# Patient Record
Sex: Male | Born: 1965 | Race: White | Hispanic: No | Marital: Married | Smoking: Never smoker
Health system: Southern US, Community
[De-identification: ages and names within clinical notes are randomized; demographics above are authoritative.]

## PROBLEM LIST (undated history)

## (undated) DIAGNOSIS — J45909 Unspecified asthma, uncomplicated: Secondary | ICD-10-CM

## (undated) HISTORY — DX: Unspecified asthma, uncomplicated: J45.909

## (undated) HISTORY — PX: TONSILLECTOMY AND ADENOIDECTOMY: SUR1326

## (undated) HISTORY — PX: TYMPANOSTOMY TUBE PLACEMENT: SHX32

---

## 2010-11-07 ENCOUNTER — Inpatient Hospital Stay (INDEPENDENT_AMBULATORY_CARE_PROVIDER_SITE_OTHER)
Admission: RE | Admit: 2010-11-07 | Discharge: 2010-11-07 | Disposition: A | Discharge status: Home / Self Care | Payer: 59 | Source: Ambulatory Visit | Attending: Emergency Medicine | Admitting: Emergency Medicine

## 2010-11-07 ENCOUNTER — Encounter: Payer: Self-pay | Admitting: Emergency Medicine

## 2010-11-07 DIAGNOSIS — J0100 Acute maxillary sinusitis, unspecified: Secondary | ICD-10-CM

## 2010-11-07 DIAGNOSIS — J039 Acute tonsillitis, unspecified: Secondary | ICD-10-CM

## 2010-11-07 LAB — CONVERTED CEMR LAB: Rapid Strep: NEGATIVE

## 2011-04-28 NOTE — Progress Notes (Signed)
Summary: SORE THORAT (rm 2)   Vital Signs:  Patient Profile:   45 Years Old Male CC:      sore throat, productive cough, ear pain, HA x 4 days Height:     68 inches Weight:      237 pounds O2 Sat:      97 % O2 treatment:    Room Air Temp:     99.4 degrees F oral Pulse rate:   71 / minute Resp:     14 per minute BP sitting:   140 / 89  (left arm) Cuff size:   large  Vitals Entered By: Lajean Saver RN (November 07, 2010 10:13 AM)                  Updated Prior Medication List: MELOXICAM 7.5 MG TABS (MELOXICAM) taking for 30 days  Current Allergies: No known allergies History of Present Illness Chief Complaint: sore throat, productive cough, ear pain, HA x 4 days History of Present Illness: Pt complains of 4 days of congestion with yellow-green rhinorrhea,  and sore throat. + bilat ear pain. +  productive cough, Had mild dyspnea, not currently. No chest pain. No wheezing.  No nausea No vomiting. + fever, + chills  + recent exposure to strep  REVIEW OF SYSTEMS Constitutional Symptoms       Complains of fever.     Denies chills, night sweats, weight loss, weight gain, and fatigue.      Comments: low grade Eyes       Denies change in vision, eye pain, eye discharge, glasses, contact lenses, and eye surgery. Ear/Nose/Throat/Mouth       Complains of ear pain, sinus problems, sore throat, and hoarseness.      Denies hearing loss/aids, change in hearing, ear discharge, dizziness, frequent runny nose, frequent nose bleeds, and tooth pain or bleeding.  Respiratory       Complains of productive cough and shortness of breath.      Denies dry cough, wheezing, asthma, bronchitis, and emphysema/COPD.  Cardiovascular       Denies murmurs, chest pain, and tires easily with exhertion.    Gastrointestinal       Denies stomach pain, nausea/vomiting, diarrhea, constipation, blood in bowel movements, and indigestion. Genitourniary       Denies painful urination, kidney stones, and loss  of urinary control. Neurological       Complains of headaches.      Denies paralysis, seizures, and fainting/blackouts. Musculoskeletal       Denies muscle pain, joint pain, joint stiffness, decreased range of motion, redness, swelling, muscle weakness, and gout.  Skin       Denies bruising, unusual mles/lumps or sores, and hair/skin or nail changes.  Psych       Denies mood changes, temper/anger issues, anxiety/stress, speech problems, depression, and sleep problems. Other Comments: Recent exposure to strep. Taken tylenol cold and sore throat   Past History:  Past Medical History: Hx PNA  Past Surgical History: Tonsillectomy  Social History: Never Smoked Alcohol use-no Drug use-no Smoking Status:  never Drug Use:  no Physical Exam General appearance: well developed, well nourished, no acute distress Head: normocephalic, atraumatic. + mild max sinus ttp bilat Eyes: conjunctivae and lids normal Ears: minimal bilat serous om. otherwise wnl Nasal: thick yellowish drainage Oral/Pharynx: bilateral mild tonsilar enlargement and redness, uvula midline without deviation Neck: supple,minimal anterior lymphadenopathy present Chest/Lungs: no rales, wheezes, or rhonchi bilateral, breath sounds equal without effort Heart: regular rate and  rhythm, no murmur Neurological: grossly intact and non-focal Skin: no obvious rashes or lesions MSE: oriented to time, place, and person Assessment New Problems: ACUTE MAXILLARY SINUSITIS (ICD-461.0) ACUTE TONSILLITIS (ICD-463)   Patient Education: Patient and/or caregiver instructed in the following: rest, fluids, Tylenol prn. otc tx discussed.  Plan New Medications/Changes: AUGMENTIN 875-125 MG TABS (AMOXICILLIN-POT CLAVULANATE) 1 by mouth two times a day with food for 10 days  #20 x 0, 11/07/2010, Lajean Manes MD  New Orders: Rapid Strep [id number] New Patient Level III [99203] Planning Comments:   Risks, benefits, alternatives  discussed. Pt voiced understanding and agreement.  Follow Up: Follow up in 2-3 days if no improvement, Follow up with Primary Physician  The patient and/or caregiver has been counseled thoroughly with regard to medications prescribed including dosage, schedule, interactions, rationale for use, and possible side effects and they verbalize understanding.  Diagnoses and expected course of recovery discussed and will return if not improved as expected or if the condition worsens. Patient and/or caregiver verbalized understanding.  Prescriptions: AUGMENTIN 875-125 MG TABS (AMOXICILLIN-POT CLAVULANATE) 1 by mouth two times a day with food for 10 days  #20 x 0   Entered and Authorized by:   Lajean Manes MD   Signed by:   Lajean Manes MD on 11/07/2010   Method used:   Electronically to        [facility] [phone]* (retail)       [address] [place], [place]  [address]       Ph: [phone] or [phone]       Fax: [phone]   RxID:   [id number]   Orders Added: 1)  Rapid Strep [03474] 2)  New Patient Level III [99203]    Laboratory Results  Date/Time Received: November 07, 2010 10:30 AM  Date/Time Reported: November 07, 2010 10:30 AM   Other Tests  Rapid Strep: negative

## 2011-12-05 ENCOUNTER — Emergency Department
Admission: EM | Admit: 2011-12-05 | Discharge: 2011-12-05 | Disposition: A | Discharge status: Home / Self Care | Payer: BC Managed Care – PPO | Source: Home / Self Care

## 2011-12-05 DIAGNOSIS — H60399 Other infective otitis externa, unspecified ear: Secondary | ICD-10-CM

## 2011-12-05 DIAGNOSIS — H6090 Unspecified otitis externa, unspecified ear: Secondary | ICD-10-CM

## 2011-12-05 MED ORDER — NEOMYCIN-POLYMYXIN-HC 3.5-10000-1 OT SOLN: 3.0000 [drp] | Freq: Four times a day (QID) | OTIC | Status: AC

## 2011-12-05 NOTE — ED Provider Notes (Signed)
Agree with exam, assessment, and plan.   Lattie Haw, MD 12/05/11 2238

## 2011-12-05 NOTE — ED Notes (Signed)
Christian Mata complains of right ear pain for a couple of days. He did go swimming 2 nights ago. Denies fever, chills or sweats.

## 2011-12-05 NOTE — ED Provider Notes (Signed)
History     CSN: [id number]  Arrival date & time 12/05/11  4098   First MD Initiated Contact with Patient 12/05/11 1906      Chief Complaint  Patient presents with  . Otalgia    right ear pain for a couple days   Patient is a 46 y.o. male presenting with ear pain.  Otalgia This is a new problem. The current episode started yesterday. There is pain in the right ear. The problem occurs constantly. The problem has not changed since onset.Associated symptoms include rash. Pertinent negatives include no ear discharge, no headaches, no hearing loss and no neck pain. Associated symptoms comments: + R ear fullness, R ear discomfort .  Pt swims in his pool on a regular basis.  Nonsmoker.   History reviewed. No pertinent past medical history.  Past Surgical History  Procedure Date  . Tonsillectomy and adenoidectomy     Family History  Problem Relation Age of Onset  . Cancer Mother     Lung  . Cancer Father     brain  . Cancer Other     bone    History  Substance Use Topics  . Smoking status: Never Smoker   . Smokeless tobacco: Never Used  . Alcohol Use: 1.2 oz/week    2 Shots of liquor per week      Review of Systems  HENT: Positive for ear pain. Negative for hearing loss, neck pain and ear discharge.   Skin: Positive for rash.  Neurological: Negative for headaches.  All other systems reviewed and are negative.    Allergies  Review of patient's allergies indicates no known allergies.  Home Medications   Current Outpatient Rx  Name Route Sig Dispense Refill  . FEXOFENADINE HCL 180 MG PO TABS Oral Take 180 mg by mouth daily.    . NEOMYCIN-POLYMYXIN-HC 3.5-10000-1 OT SOLN Right Ear Place 3 drops into the right ear 4 (four) times daily. 10 mL 0    BP 132/79  Pulse 63  Temp 97.5 F (36.4 C) (Oral)  Resp 18  Ht 5' 9.5" (1.765 m)  Wt 238 lb (107.956 kg)  BMI 34.64 kg/m2  SpO2 95%  Physical Exam  Constitutional: He appears well-developed and well-nourished.    HENT:  Head: Normocephalic and atraumatic.  Left Ear: External ear normal.       R ear canal erythema + tenderness to otoscopic evaluation  Eyes: Conjunctivae are normal. Pupils are equal, round, and reactive to light.  Neck: Normal range of motion. Neck supple.  Cardiovascular: Normal rate and regular rhythm.   Pulmonary/Chest: Effort normal. No respiratory distress.  Abdominal: Soft.  Musculoskeletal: Normal range of motion.  Neurological: He is alert.  Skin: Skin is warm.    ED Course  Procedures (including critical care time)  Labs Reviewed - No data to display No results found.   1. Otitis externa       MDM  Will treat with cortisporin otic. Discussed infectious red flags and safe swimming practices. Handout given.  Follow up as needed.     The patient and/or caregiver has been counseled thoroughly with regard to treatment plan and/or medications prescribed including dosage, schedule, interactions, rationale for use, and possible side effects and they verbalize understanding. Diagnoses and expected course of recovery discussed and will return if not improved as expected or if the condition worsens. Patient and/or caregiver verbalized understanding.             Floydene Flock, MD 12/05/11  1944 

## 2013-09-22 ENCOUNTER — Emergency Department
Admission: EM | Admit: 2013-09-22 | Discharge: 2013-09-22 | Disposition: A | Discharge status: Home / Self Care | Payer: 59 | Source: Home / Self Care | Attending: Emergency Medicine | Admitting: Emergency Medicine

## 2013-09-22 ENCOUNTER — Encounter: Payer: Self-pay | Admitting: Emergency Medicine

## 2013-09-22 DIAGNOSIS — L03113 Cellulitis of right upper limb: Secondary | ICD-10-CM

## 2013-09-22 DIAGNOSIS — L02413 Cutaneous abscess of right upper limb: Secondary | ICD-10-CM

## 2013-09-22 DIAGNOSIS — IMO0002 Reserved for concepts with insufficient information to code with codable children: Secondary | ICD-10-CM

## 2013-09-22 MED ORDER — DOXYCYCLINE HYCLATE 100 MG PO CAPS: 100.0000 mg | ORAL_CAPSULE | Freq: Two times a day (BID) | ORAL | Status: DC

## 2013-09-22 NOTE — ED Notes (Signed)
Reports noticing a lump with bruising coloration and tiny focal point just distal to right elbow 24 hours ago. No known injury. Does work outdoors.

## 2013-09-22 NOTE — ED Provider Notes (Signed)
CSN: [id number]     Arrival date & time 09/22/13  1805 History   First MD Initiated Contact with Patient 09/22/13 1828     Chief Complaint  Patient presents with  . Mass   (Consider location/radiation/quality/duration/timing/severity/associated sxs/prior Treatment) HPI Swelling and pain skin right forearm. Times one day . Pain is mild. Exacerbated by touching it. He tried squeezing pimple in the Center, but didn't get anything out. He feels that made it worse.  He recalls no specific injury or insect bite, but he has been outside in the woods, and may have nicked the area in a briar.  Otherwise, denies any other symptoms. No fever or chills. No red streaks. Denies any bony pain. Denies elbow or wrist symptoms.  History reviewed. No pertinent past medical history. Past Surgical History  Procedure Laterality Date  . Tonsillectomy and adenoidectomy     Family History  Problem Relation Age of Onset  . Cancer Mother     Lung  . Cancer Father     brain  . Cancer Other     bone   History  Substance Use Topics  . Smoking status: Never Smoker   . Smokeless tobacco: Never Used  . Alcohol Use: 1.2 oz/week    2 Shots of liquor per week    Review of Systems  All other systems reviewed and are negative.   Allergies  Review of patient's allergies indicates no known allergies.  Home Medications   Prior to Admission medications   Medication Sig Start Date End Date Taking? Authorizing Provider  doxycycline (VIBRAMYCIN) 100 MG capsule Take 1 capsule (100 mg total) by mouth 2 (two) times daily. 09/22/13   Jacqulyn Cane, MD  fexofenadine (ALLEGRA) 180 MG tablet Take 180 mg by mouth daily.    Historical Provider, MD   BP 130/73  Pulse 60  Temp(Src) 97.8 F (36.6 C) (Oral)  Resp 16  Ht 5' 8.75" (1.746 m)  Wt 232 lb (105.235 kg)  BMI 34.52 kg/m2  SpO2 97% Physical Exam  Nursing note and vitals reviewed. Constitutional: He is oriented to person, place, and time. He appears  well-developed and well-nourished. No distress.  HENT:  Head: Normocephalic and atraumatic.  Eyes: Conjunctivae and EOM are normal. Pupils are equal, round, and reactive to light. No scleral icterus.  Neck: Normal range of motion.  Cardiovascular: Normal rate.   Pulmonary/Chest: Effort normal.  Abdominal: He exhibits no distension.  Musculoskeletal: Normal range of motion.       Right elbow: Normal.      Right forearm: He exhibits swelling.  Right forearm: 1 x 1 cm area of swelling, mild tenderness, erythema, warmth, and induration. In the Center is a tiny pustule.--No bony or deep tenderness. No red streaks.  Neurovascular distally intact Range of motion and tendons right upper extremity intact  Neurological: He is alert and oriented to person, place, and time.  Skin: Skin is warm.  Psychiatric: He has a normal mood and affect.    ED Course  Procedures (including critical care time) Labs Review Labs Reviewed  WOUND CULTURE    Imaging Review No results found.   MDM   1. Abscess of forearm, right   2. Cellulitis of forearm, right    Treatment options discussed, as well as risks, benefits, alternatives. Patient voiced understanding and agreement with the following plans:  After risks, benefits, alternatives discussed, patient requested and consented to the following procedure : After sterile prep, I nicked the pustule with #11 blade and  tiny amount of pus and blood exuded. Sent off for culture. Band-Aid applied. Good hemostasis. He tolerated well  Treat with doxycycline 100 mg twice a day x10 days Wound culture pending Red flags discussed. Followup here or with PCP if not improving in 3-4 days, sooner if worse or new  Precautions discussed. Red flags discussed. Questions invited and answered. Patient voiced understanding and agreement.     Jacqulyn Cane, MD 09/22/13 [phone]

## 2013-09-25 LAB — WOUND CULTURE
Gram Stain: NONE SEEN
Gram Stain: NONE SEEN
Gram Stain: NONE SEEN
Organism ID, Bacteria: NO GROWTH

## 2013-09-27 ENCOUNTER — Telehealth: Payer: Self-pay | Admitting: *Deleted

## 2014-05-09 ENCOUNTER — Emergency Department
Admission: EM | Admit: 2014-05-09 | Discharge: 2014-05-09 | Disposition: A | Discharge status: Home / Self Care | Payer: 59 | Source: Home / Self Care | Attending: Emergency Medicine | Admitting: Emergency Medicine

## 2014-05-09 ENCOUNTER — Encounter: Payer: Self-pay | Admitting: *Deleted

## 2014-05-09 ENCOUNTER — Emergency Department (INDEPENDENT_AMBULATORY_CARE_PROVIDER_SITE_OTHER): Payer: 59

## 2014-05-09 DIAGNOSIS — S60021A Contusion of right index finger without damage to nail, initial encounter: Secondary | ICD-10-CM

## 2014-05-09 DIAGNOSIS — S61210A Laceration without foreign body of right index finger without damage to nail, initial encounter: Secondary | ICD-10-CM

## 2014-05-09 DIAGNOSIS — Z23 Encounter for immunization: Secondary | ICD-10-CM

## 2014-05-09 DIAGNOSIS — W230XXA Caught, crushed, jammed, or pinched between moving objects, initial encounter: Secondary | ICD-10-CM

## 2014-05-09 DIAGNOSIS — T1490XA Injury, unspecified, initial encounter: Secondary | ICD-10-CM

## 2014-05-09 MED ORDER — CEPHALEXIN 500 MG PO CAPS: 500.0000 mg | ORAL_CAPSULE | Freq: Two times a day (BID) | ORAL | Status: DC

## 2014-05-09 MED ORDER — TETANUS-DIPHTH-ACELL PERTUSSIS 5-2.5-18.5 LF-MCG/0.5 IM SUSP
0.5000 mL | Freq: Once | INTRAMUSCULAR | Status: AC
  Administered 2014-05-09: 0.5 mL via INTRAMUSCULAR

## 2014-05-09 NOTE — ED Provider Notes (Signed)
CSN: [id number]     Arrival date & time 05/09/14  0803 History   First MD Initiated Contact with Patient 05/09/14 0815     Chief Complaint  Patient presents with  . Finger Injury   (Consider location/radiation/quality/duration/timing/severity/associated sxs/prior Treatment) HPI About 2 hours ago, he accidentally closed right index finger on his car door. Immediate sharp pain. Pain was severe, now pain is 3 out of 10. Sustained laceration with mild bleeding, controlled with direct pressure. Minimal numbness around the wound. Last tetanus shot over 5 years ago. History reviewed. No pertinent past medical history. Past Surgical History  Procedure Laterality Date  . Tonsillectomy and adenoidectomy     Family History  Problem Relation Age of Onset  . Cancer Mother     Lung  . Cancer Father     brain  . Cancer Other     bone   History  Substance Use Topics  . Smoking status: Never Smoker   . Smokeless tobacco: Never Used  . Alcohol Use: 1.2 oz/week    2 Shots of liquor per week    Review of Systems  All other systems reviewed and are negative.   Allergies  Review of patient's allergies indicates no known allergies.  Home Medications   Prior to Admission medications   Medication Sig Start Date End Date Taking? Authorizing Provider  cephALEXin (KEFLEX) 500 MG capsule Take 1 capsule (500 mg total) by mouth 2 (two) times daily. For 5 days 05/09/14   Jacqulyn Cane, MD   BP 144/89 mmHg  Pulse 53  Temp(Src) 97.5 F (36.4 C) (Oral)  Resp 18  Ht 5\' 9"  (1.753 m)  Wt 235 lb (106.595 kg)  BMI 34.69 kg/m2  SpO2 97% Physical Exam  Constitutional: He is oriented to person, place, and time. He appears well-developed and well-nourished. No distress.  HENT:  Head: Normocephalic and atraumatic.  Eyes: Conjunctivae and EOM are normal. Pupils are equal, round, and reactive to light. No scleral icterus.  Neck: Normal range of motion.  Cardiovascular: Normal rate.   Pulmonary/Chest:  Effort normal.  Abdominal: He exhibits no distension.  Musculoskeletal: Normal range of motion.       Hands: As depicted, palmar aspect of right index finger at the DIP: Full skin thickness 2 cm linear laceration. Wound cleansed vigorously with Hibiclens. Irrigated Inspected again, no foreign body seen. No active bleeding. Neurovascular bundle intact. Has minimal numbness around the wound but fingertip sensation intact. There is bony tenderness along the DIP, with normal range of motion. Tendons intact.  Neurological: He is alert and oriented to person, place, and time.  Skin: Skin is warm.  Psychiatric: He has a normal mood and affect.  Nursing note and vitals reviewed.   ED Course  Procedures (including critical care time) Labs Review Labs Reviewed - No data to display  Imaging Review Dg Finger Index Right  05/09/2014   CLINICAL DATA:  Closed door on finger this morning. Laceration to the posterior anterior DIP joint. Initial encounter.  EXAM: RIGHT INDEX FINGER 2+V  COMPARISON:  None.  FINDINGS: There is no evidence of fracture or dislocation. 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 Soft tissues are unremarkable.  IMPRESSION: No acute osseous injury of the right second digit.   Electronically Signed   By: Kathreen Devoid   On: 05/09/2014 08:37     MDM   1. Laceration of right index finger w/o foreign body w/o damage to nail, initial encounter   2. Injury  3. Contusion of right index finger without damage to nail, initial encounter    Treatment options discussed, as well as risks, benefits, alternatives. Patient voiced understanding and agreement with the following plans: Procedure: Risks, benefits, alternatives discussed. Patient agreed and consented to the following procedure: Wound cleansed and irrigated with sterile technique. No evidence of tendon or neurovascular deficit. No foreign body. Wound repaired with surgical glue with good approximation and hemostasis.  Nonstick dressing, bulky Coban dressing. Wound care discussed. Red flags discussed.  Reviewed x-ray right finger negative for any acute abnormality. He declined prescription pain meds, but may use ibuprofen when necessary pain. DTaP . Cephalexin prescribed to help prevent infection See detailed Instructions in AVS, which were given to patient. Verbal instructions also given. Risks, benefits, and alternatives of treatment options discussed. Questions invited and answered. Patient voiced understanding and agreement with plans.     Jacqulyn Cane, MD 05/09/14 1006

## 2014-05-09 NOTE — Discharge Instructions (Signed)

## 2014-05-09 NOTE — ED Notes (Signed)
Christian Mata reports that he closed his RT 2nd finger in his car door this morning. He reports 2 lacerations on his finger.

## 2014-08-20 ENCOUNTER — Emergency Department (HOSPITAL_BASED_OUTPATIENT_CLINIC_OR_DEPARTMENT_OTHER): Payer: 59

## 2014-08-20 ENCOUNTER — Encounter (HOSPITAL_BASED_OUTPATIENT_CLINIC_OR_DEPARTMENT_OTHER): Payer: Self-pay

## 2014-08-20 ENCOUNTER — Emergency Department (HOSPITAL_BASED_OUTPATIENT_CLINIC_OR_DEPARTMENT_OTHER)
Admission: EM | Admit: 2014-08-20 | Discharge: 2014-08-20 | Disposition: A | Discharge status: Home / Self Care | Payer: 59 | Attending: Emergency Medicine | Admitting: Emergency Medicine

## 2014-08-20 DIAGNOSIS — W458XXA Other foreign body or object entering through skin, initial encounter: Secondary | ICD-10-CM | POA: Diagnosis not present

## 2014-08-20 DIAGNOSIS — Y9389 Activity, other specified: Secondary | ICD-10-CM | POA: Insufficient documentation

## 2014-08-20 DIAGNOSIS — Y9289 Other specified places as the place of occurrence of the external cause: Secondary | ICD-10-CM | POA: Insufficient documentation

## 2014-08-20 DIAGNOSIS — S61219A Laceration without foreign body of unspecified finger without damage to nail, initial encounter: Secondary | ICD-10-CM

## 2014-08-20 DIAGNOSIS — S65506A Unspecified injury of blood vessel of right little finger, initial encounter: Secondary | ICD-10-CM | POA: Diagnosis present

## 2014-08-20 DIAGNOSIS — Y998 Other external cause status: Secondary | ICD-10-CM | POA: Insufficient documentation

## 2014-08-20 MED ORDER — HYDROCODONE-ACETAMINOPHEN 5-325 MG PO TABS: ORAL_TABLET | ORAL | Status: DC

## 2014-08-20 MED ORDER — CEPHALEXIN 250 MG PO CAPS
500.0000 mg | ORAL_CAPSULE | Freq: Once | ORAL | Status: AC
  Administered 2014-08-20: 500 mg via ORAL
  Filled 2014-08-20: qty 2

## 2014-08-20 MED ORDER — LIDOCAINE HCL 2 % IJ SOLN
20.0000 mL | Freq: Once | INTRAMUSCULAR | Status: AC
  Administered 2014-08-20: 400 mg via INTRADERMAL
  Filled 2014-08-20: qty 20

## 2014-08-20 MED ORDER — CEPHALEXIN 500 MG PO CAPS: 500.0000 mg | ORAL_CAPSULE | Freq: Four times a day (QID) | ORAL | Status: DC

## 2014-08-20 NOTE — Discharge Instructions (Signed)
Wash the affected area with soap and water and apply a thin layer of topical antibiotic ointment. Do this every 12 hours.   Do not use rubbing alcohol or hydrogen peroxide.                        Look for signs of infection: if you see redness, if the area becomes warm, if pain increases sharply, there is discharge (pus), if red streaks appear or you develop fever or vomiting, RETURN immediately to the Emergency Department  for a recheck.   Please follow with your primary care doctor in the next 2 days for a check-up. They must obtain records for further management.   Do not hesitate to return to the Emergency Department for any new, worsening or concerning symptoms.    Fingernail Removal Fingernails may need to be removed because of injury, infections, or correction of abnormal growth. A special non-stick bandage has been put on your finger tightly to prevent bleeding. Fingernails will usually grow back if the finger has not been badly injured and you carefully follow instructions. HOME CARE INSTRUCTIONS   Keep your hand elevated above your heart to relieve pain and swelling.  Keep your dressing dry and clean.  Change your bandage in 24 hours.  After your bandage is changed, soak your hand in warm soapy water for 10 to 20 minutes. Do this 3 times per day. This helps reduce pain and swelling. After soaking your hand, apply a clean, dry bandage. Change your bandage if it is wet or dirty.  Only take over-the-counter or prescription medicines for pain, discomfort, or fever as directed by your caregiver.  See your caregiver as needed for problems.  You may have received an instruction to follow up with your caregiver or a specialist. The failure to follow up as instructed could result in the permanent loss of a fingernail. SEEK IMMEDIATE MEDICAL CARE IF:   You have increased pain, swelling, drainage, or bleeding.  You have a fever. MAKE SURE YOU:   Understand these  instructions.  Will watch your condition.  Will get help right away if you are not doing well or get worse. Document Released: 05/09/2000 Document Revised: 08/04/2011 Document Reviewed: 09/14/2007 [facility] Patient Information 2015 [place], [place]. This information is not intended to replace advice given to you by your health care provider. Make sure you discuss any questions you have with your health care provider.

## 2014-08-20 NOTE — ED Notes (Signed)
PA at bedside.

## 2014-08-20 NOTE — ED Notes (Signed)
Patient here with wood splinter under right hand little finger nailbed x 1 day, pain, redness, and swelling to same

## 2014-08-20 NOTE — ED Notes (Signed)
Pt reports yesterday was trimming dogs nails when dog was wiggling.  States he attempted to stop dog from wiggling forcefully when he impacted wood on side of pool with right fifth digit.  Splinter under lateral aspect of nailbed on same, bruising and some cut nail noted.  Small amount of swelling and tenderness to site.

## 2014-08-20 NOTE — ED Provider Notes (Signed)
CSN: [id number]     Arrival date & time 08/20/14  1138 History   First MD Initiated Contact with Patient 08/20/14 1255     Chief Complaint  Patient presents with  . Foreign Body in Skin     (Consider location/radiation/quality/duration/timing/severity/associated sxs/prior Treatment) HPI   Christian Mata is a 49 y.o. male complaining of wooden splinter to left small digit underneath the nail onset yesterday. Last tetanus shot was within the last 2 weeks. He states that when he was trying to trim his dog's nails he accidentally hit the hand on his deck in the splinter went under. Pain is minimal, 3 out of 10 and exacerbated by movement and palpation. Not taking any pain medication at home. Patient denies diabetes, chronic steroids or other immunosuppressive conditions. He is left-hand dominant.  History reviewed. No pertinent past medical history. Past Surgical History  Procedure Laterality Date  . Tonsillectomy and adenoidectomy     Family History  Problem Relation Age of Onset  . Cancer Mother     Lung  . Cancer Father     brain  . Cancer Other     bone   History  Substance Use Topics  . Smoking status: Never Smoker   . Smokeless tobacco: Never Used  . Alcohol Use: 1.2 oz/week    2 Shots of liquor per week    Review of Systems  10 systems reviewed and found to be negative, except as noted in the HPI.   Allergies  Review of patient's allergies indicates no known allergies.  Home Medications   Prior to Admission medications   Medication Sig Start Date End Date Taking? Authorizing Provider  cephALEXin (KEFLEX) 500 MG capsule Take 1 capsule (500 mg total) by mouth 4 (four) times daily. 08/20/14   Jaydrien Wassenaar, PA-C  HYDROcodone-acetaminophen (NORCO/VICODIN) 5-325 MG per tablet Take 1-2 tablets by mouth every 6 hours as needed for pain and/or cough. 08/20/14   Blong Busk, PA-C   BP 129/59 mmHg  Pulse 56  Temp(Src) 97.6 F (36.4 C) (Oral)  Resp 18  Ht 5\' 9"   (1.753 m)  Wt 227 lb (102.967 kg)  BMI 33.51 kg/m2  SpO2 98% Physical Exam  Constitutional: He is oriented to person, place, and time. He appears well-developed and well-nourished. No distress.  HENT:  Head: Normocephalic.  Eyes: Conjunctivae and EOM are normal.  Cardiovascular: Normal rate.   Pulmonary/Chest: Effort normal. No stridor.  Musculoskeletal: Normal range of motion.  Neurological: He is alert and oriented to person, place, and time.  Skin:  Point or extending the length of the nail on the right fifth digit on the ulnar side. bleeding is controlled.  Psychiatric: He has a normal mood and affect.  Nursing note and vitals reviewed.   ED Course  NAIL REMOVAL Date/Time: 08/20/2014 1:32 PM Performed by: Monico Blitz Authorized by: Monico Blitz Consent: Verbal consent obtained. Risks and benefits: risks, benefits and alternatives were discussed Consent given by: patient Patient identity confirmed: verbally with patient Time out: Immediately prior to procedure a "time out" was called to verify the correct patient, procedure, equipment, support staff and site/side marked as required. Location: right hand Location details: right small finger Anesthesia: digital block Local anesthetic: lidocaine 2% without epinephrine Anesthetic total: 4 ml Patient sedated: no Preparation: skin prepped with ChloraPrep Amount removed: 1/2 Side: ulnar Wedge excision of skin of nail fold: no Nail bed sutured: no Nail matrix removed: none Removed nail replaced and anchored: no Dressing: antibiotic ointment, non-adhesive  packing strip and 4x4 Patient tolerance: Patient tolerated the procedure well with no immediate complications  FOREIGN BODY REMOVAL Date/Time: 08/20/2014 2:11 PM Performed by: Monico Blitz Authorized by: Monico Blitz Consent: Verbal consent obtained. Consent given by: patient Patient identity confirmed: verbally with patient Body area: skin General  location: upper extremity Location details: right small finger Anesthesia: digital block Local anesthetic: lidocaine 2% without epinephrine Anesthetic total: 4 ml Patient sedated: no Patient restrained: no Localization method: magnification and visualized Removal mechanism: forceps and irrigation Dressing: antibiotic ointment Tendon involvement: superficial Depth: subcutaneous Complexity: simple 1 objects recovered. Objects recovered: wood splinterr Post-procedure assessment: foreign body removed Patient tolerance: Patient tolerated the procedure well with no immediate complications   (including critical care time) Labs Review Labs Reviewed - No data to display  Imaging Review Dg Hand Complete Right  08/20/2014   CLINICAL DATA:  Trauma to the right fifth digit with wood splinter visualized per patient  EXAM: RIGHT HAND - COMPLETE 3+ VIEW  COMPARISON:  05/09/2014 right index finger radiograph  FINDINGS: Well corticated remote tuft fracture of the right fourth digit is identified. Nail bed deformity is visualized at the reported location of this finding at the distal right fifth digit. No radiopaque foreign body. No fracture or dislocation is scratch but no acute fracture or dislocation is identified.  IMPRESSION: Right fifth digit nail bed deformity at the location of the reported clinical finding. No acute fracture or dislocation or radiopaque foreign body.   Electronically Signed   By: Conchita Paris M.D.   On: 08/20/2014 12:17     EKG Interpretation None      MDM   Final diagnoses:  Laceration of finger without foreign body without damage to nail, initial encounter    Filed Vitals:   08/20/14 1149 08/20/14 1405  BP: 129/84 129/59  Pulse: 54 56  Temp: 97.8 F (36.6 C) 97.6 F (36.4 C)  TempSrc: Oral Oral  Resp: 18 18  Height: 5\' 9"  (1.753 m)   Weight: 227 lb (102.967 kg)   SpO2: 99% 98%    Medications  lidocaine (XYLOCAINE) 2 % (with pres) injection 400 mg (400  mg Intradermal Given by Other 08/20/14 1305)  cephALEXin (KEFLEX) capsule 500 mg (500 mg Oral Given 08/20/14 1305)    Christian Mata is a pleasant 49 y.o. male presenting with wooden splinter embedded deeply underneath the right fifth nail bed. Patient's tetanus is up-to-date, I had to remove half of the nail to access the foreign body. Patient will be started on Keflex, given instructions on wound care. Vicodin given for pain control.  Evaluation does not show pathology that would require ongoing emergent intervention or inpatient treatment. Pt is hemodynamically stable and mentating appropriately. Discussed findings and plan with patient/guardian, who agrees with care plan. All questions answered. Return precautions discussed and outpatient follow up given.   New Prescriptions   CEPHALEXIN (KEFLEX) 500 MG CAPSULE    Take 1 capsule (500 mg total) by mouth 4 (four) times daily.   HYDROCODONE-ACETAMINOPHEN (NORCO/VICODIN) 5-325 MG PER TABLET    Take 1-2 tablets by mouth every 6 hours as needed for pain and/or cough.         Monico Blitz, PA-C 08/20/14 [place], MD 08/20/14 [phone]

## 2014-12-02 ENCOUNTER — Encounter: Payer: Self-pay | Admitting: Emergency Medicine

## 2014-12-02 ENCOUNTER — Emergency Department
Admission: EM | Admit: 2014-12-02 | Discharge: 2014-12-02 | Disposition: A | Discharge status: Home / Self Care | Payer: 59 | Source: Home / Self Care | Attending: Family Medicine | Admitting: Family Medicine

## 2014-12-02 DIAGNOSIS — R51 Headache: Secondary | ICD-10-CM

## 2014-12-02 DIAGNOSIS — W57XXXA Bitten or stung by nonvenomous insect and other nonvenomous arthropods, initial encounter: Secondary | ICD-10-CM

## 2014-12-02 DIAGNOSIS — R6889 Other general symptoms and signs: Secondary | ICD-10-CM

## 2014-12-02 DIAGNOSIS — R519 Headache, unspecified: Secondary | ICD-10-CM

## 2014-12-02 DIAGNOSIS — T148 Other injury of unspecified body region: Secondary | ICD-10-CM

## 2014-12-02 DIAGNOSIS — Z8619 Personal history of other infectious and parasitic diseases: Secondary | ICD-10-CM

## 2014-12-02 LAB — BASIC METABOLIC PANEL WITH GFR
BUN: 20 mg/dL (ref 6–23)
CO2: 23 mEq/L (ref 19–32)
Calcium: 9.1 mg/dL (ref 8.4–10.5)
Chloride: 105 mEq/L (ref 96–112)
Creat: 0.97 mg/dL (ref 0.50–1.35)
GFR, Est African American: >89 mL/min
GFR, Est Non African American: >89 mL/min
Glucose, Bld: 95 mg/dL (ref 70–99)
Potassium: 3.9 mEq/L (ref 3.5–5.3)
Sodium: 139 mEq/L (ref 135–145)

## 2014-12-02 LAB — POCT CBC W AUTO DIFF (K'VILLE URGENT CARE)

## 2014-12-02 MED ORDER — DOXYCYCLINE HYCLATE 100 MG PO CAPS: 100.0000 mg | ORAL_CAPSULE | Freq: Two times a day (BID) | ORAL | Status: DC

## 2014-12-02 MED ORDER — VALACYCLOVIR HCL 1 G PO TABS: 2000.0000 mg | ORAL_TABLET | Freq: Two times a day (BID) | ORAL | Status: DC

## 2014-12-02 NOTE — Discharge Instructions (Signed)
You may take acetaminophen and ibuprofen as needed for pain. Be sure to stay well hydrated. Take antibiotics and antivirals exactly as prescribed. You did have blood work performed today to test for tick-borne illnesses, you should be notified within 4 days of results and advised if any additional treatment or follow up is indicated. If symptoms worsen including severe pain, difficulty breathing, unable to keep down fluids, or other new concerning symptoms develop, please go to the closest ER or call 911 Please establish care with a primary care provider for ongoing healthcare needs, including continued management of recurrent cold sores.  See below for further instructions.

## 2014-12-02 NOTE — ED Provider Notes (Signed)
CSN: [id number]     Arrival date & time 12/02/14  1116 History   First MD Initiated Contact with Patient 12/02/14 1151     Chief Complaint  Patient presents with  . Headache  . Fatigue   (Consider location/radiation/quality/duration/timing/severity/associated sxs/prior Treatment) HPI  Patient is a 49 year old male presenting to urgent care with complaints of intermittent generalized headache that is mild to moderate in severity with associated body aches, fever with Tmax of 103, as well as mild intermittent watery diarrhea without blood or mucus in stool.  Symptoms have been present for last 2 weeks.  Patient states he also has a lot of fatigue and generalized weakness.  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  Patient does note a mild red rash on bilateral forearms described as tiny little red circles that do not hurt or itch.  Rash has started to improve on its own.  No known allergies.  Denies neck pain or stiffness.  Denies headache at this time.  Denies chest pain or difficulty breathing.  Reports mild nausea but denies vomiting.  Denies sick contacts or recent travel.    On a, unrelated matter, pt requests a medication be prescribed for recurrent cold sores.  States a family member has had valacyclovir for cold sores and does report significant improvement. Patient would like to try this medication.  Denies any cold sores at this time.  States he only gets them a handful of times a year when he gets sick or stressed. Patient states he is otherwise healthy.  No other significant past medical history.  History reviewed. No pertinent past medical history. Past Surgical History  Procedure Laterality Date  . Tonsillectomy and adenoidectomy     Family History  Problem Relation Age of Onset  . Cancer Mother     Lung  . Cancer Father     brain  . Cancer Other     bone   History  Substance Use Topics  . Smoking status: Never Smoker    . Smokeless tobacco: Never Used  . Alcohol Use: 1.2 oz/week    2 Shots of liquor per week    Review of Systems  Constitutional: Positive for fever ( 103), chills and fatigue. Negative for diaphoresis and appetite change.  HENT: Negative for congestion, sore throat, trouble swallowing and voice change.   Respiratory: Negative for cough and shortness of breath.   Cardiovascular: Negative for chest pain and leg swelling.  Gastrointestinal: Positive for nausea and diarrhea. Negative for vomiting, abdominal pain and blood in stool.  Musculoskeletal: Positive for myalgias and arthralgias. Negative for neck pain and neck stiffness.  Skin: Positive for rash. Negative for color change, pallor and wound.  Neurological: Positive for weakness ( generalized) and headaches. Negative for dizziness, seizures, syncope, light-headedness and numbness.  All other systems reviewed and are negative.   Allergies  Review of patient's allergies indicates no known allergies.  Home Medications   Prior to Admission medications   Medication Sig Start Date End Date Taking? Authorizing Provider  cephALEXin (KEFLEX) 500 MG capsule Take 1 capsule (500 mg total) by mouth 4 (four) times daily. 08/20/14   Nicole Pisciotta, PA-C  doxycycline (VIBRAMYCIN) 100 MG capsule Take 1 capsule (100 mg total) by mouth 2 (two) times daily. One po bid x 10 days 12/02/14   Noland Fordyce, PA-C  HYDROcodone-acetaminophen (NORCO/VICODIN) 5-325 MG per tablet Take 1-2 tablets by mouth every 6 hours as needed for  pain and/or cough. 08/20/14   Nicole Pisciotta, PA-C  valACYclovir (VALTREX) 1000 MG tablet Take 2 tablets (2,000 mg total) by mouth 2 (two) times daily. Once per occurance 12/02/14   Noland Fordyce, PA-C   BP 148/78 mmHg  Pulse 96  Temp(Src) 98.3 F (36.8 C) (Oral)  SpO2 96% Physical Exam  Constitutional: He is oriented to person, place, and time. He appears well-developed and well-nourished.  Patient sitting on exam table, appears  well, nontoxic.  No acute distress.  HENT:  Head: Normocephalic and atraumatic.  Right Ear: Hearing, tympanic membrane, external ear and ear canal normal.  Left Ear: Hearing, tympanic membrane, external ear and ear canal normal.  Nose: Nose normal.  Mouth/Throat: Uvula is midline, oropharynx is clear and moist and mucous membranes are normal.  Eyes: Conjunctivae and EOM are normal. No scleral icterus.  Neck: Normal range of motion. Neck supple.  Cardiovascular: Normal rate, regular rhythm and normal heart sounds.   Pulmonary/Chest: Effort normal and breath sounds normal. No respiratory distress. He has no wheezes. He has no rales. He exhibits no tenderness.  Abdominal: Soft. Bowel sounds are normal. He exhibits no distension and no mass. There is no tenderness. There is no rebound and no guarding.  Musculoskeletal: Normal range of motion.  Neurological: He is alert and oriented to person, place, and time. He has normal strength. No cranial nerve deficit or sensory deficit. Coordination normal. GCS eye subscore is 4. GCS verbal subscore is 5. GCS motor subscore is 6.  Skin: Skin is warm and dry. Rash noted.  Faint erythematous papular rash on bilateral forearms. No evidence of underlying infection. No vesicles.  No red streaking.  No rash on palms or soles.  No erythema migrans present.  Nursing note and vitals reviewed.   ED Course  Procedures (including critical care time) Labs Review Labs Reviewed  ROCKY MTN SPOTTED FVR ABS PNL(IGG+IGM)  BASIC METABOLIC PANEL WITH GFR  LYME DISEASE DNA BY PCR([facility])  POCT CBC W AUTO DIFF ([facility])    Imaging Review No results found.   MDM   1. Flu-like symptoms   2. Tick bites   3. H/O cold sores   4. Recurrent headache     Patient is a 49 year old male presenting to urgent care with flulike symptoms over last 2 weeks.  Patient does report removing several simple last few weeks as he works outside often.  No  characteristic erythema migrans seen on exam.  However, due to patient's risks, and flulike symptoms, will start patient on doxycycline for 10 days.  Labs drawn today: CBC, BMP, and [facility] spotted fever as well as Lyme disease.  Will notify patient of results as they come back and call in any additional medication if needed or advise patient if any additional workup is needed.    Advised to establish care with a PCP for ongoing healthcare needs and recheck symptoms in one week if not improving.  Discussed emergent symptoms that warrant visit to emergency room or call 911.  Also filled prescription for valacyclovir for cold sores.  Discussed how to use this medication.  Home care instructions provided. Return precautions provided. Pt verbalized understanding and agreement with tx plan.     Noland Fordyce, PA-C 12/02/14 1254

## 2014-12-02 NOTE — ED Notes (Signed)
Pt c/o intermittent HA, fever, body aches and diarrhea x2 weeks. States he has been having a lot of fatigue. 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

## 2014-12-04 ENCOUNTER — Telehealth: Payer: Self-pay | Admitting: *Deleted

## 2014-12-04 LAB — ROCKY MTN SPOTTED FVR ABS PNL(IGG+IGM)
RMSF IgG: 0.51 IV
RMSF IgM: 0.67 IV

## 2014-12-06 ENCOUNTER — Telehealth: Payer: Self-pay | Admitting: Emergency Medicine

## 2014-12-06 LAB — LYME DISEASE DNA BY PCR(BORRELIA BURG): B burgdorferi DNA: NOT DETECTED

## 2015-12-11 ENCOUNTER — Ambulatory Visit (INDEPENDENT_AMBULATORY_CARE_PROVIDER_SITE_OTHER): Payer: 59 | Admitting: Sports Medicine

## 2015-12-11 ENCOUNTER — Ambulatory Visit (INDEPENDENT_AMBULATORY_CARE_PROVIDER_SITE_OTHER): Payer: 59

## 2015-12-11 VITALS — BP 118/72 | HR 64 | Resp 16 | Wt 234.4 lb

## 2015-12-11 DIAGNOSIS — R635 Abnormal weight gain: Secondary | ICD-10-CM | POA: Diagnosis not present

## 2015-12-11 DIAGNOSIS — M25562 Pain in left knee: Secondary | ICD-10-CM | POA: Diagnosis not present

## 2015-12-11 DIAGNOSIS — M1711 Unilateral primary osteoarthritis, right knee: Secondary | ICD-10-CM | POA: Diagnosis not present

## 2015-12-11 DIAGNOSIS — M25561 Pain in right knee: Secondary | ICD-10-CM | POA: Diagnosis not present

## 2015-12-11 MED ORDER — PHENTERMINE HCL 37.5 MG PO TABS: ORAL_TABLET | ORAL | Status: DC

## 2015-12-11 MED ORDER — MELOXICAM 15 MG PO TABS: ORAL_TABLET | ORAL | Status: DC

## 2015-12-11 NOTE — Assessment & Plan Note (Signed)
Injection as above, x-rays, meloxicam.  Return to see me in one month.

## 2015-12-11 NOTE — Progress Notes (Signed)
   Subjective:    I'm seeing this patient as a consultation for:  Dr. Briscoe Deutscher  CC: Right knee pain  HPI: This is a pleasant 50 year old male with long-standing right knee pain, medial joint line. No discrete injuries, pain is moderate, persistent without radiation, no mechanical symptoms. He does have significant nocturnal symptoms.  Normal weight gain: Agreeable to start weight loss medication.  Past medical history, Surgical history, Family history not pertinant except as noted below, Social history, Allergies, and medications have been entered into the medical record, reviewed, and no changes needed.   Review of Systems: No headache, visual changes, nausea, vomiting, diarrhea, constipation, dizziness, abdominal pain, skin rash, fevers, chills, night sweats, weight loss, swollen lymph nodes, body aches, joint swelling, muscle aches, chest pain, shortness of breath, mood changes, visual or auditory hallucinations.   Objective:   General: Well Developed, well nourished, and in no acute distress.  Neuro/Psych: Alert and oriented x3, extra-ocular muscles intact, able to move all 4 extremities, sensation grossly intact. Skin: Warm and dry, no rashes noted.  Respiratory: Not using accessory muscles, speaking in full sentences, trachea midline.  Cardiovascular: Pulses palpable, no extremity edema. Abdomen: Does not appear distended. Right Knee: Visibly swollen with a palpable effusion and fluid wave, tender at the medial joint line, reproduction of pain with terminal flexion. ROM normal in flexion and extension and lower leg rotation. Ligaments with solid consistent endpoints including ACL, PCL, LCL, MCL. Positive McMurray's test with a click and pain. Non painful patellar compression. Patellar and quadriceps tendons unremarkable. Hamstring and quadriceps strength is normal.  Procedure: Real-time Ultrasound Guided Injection of right knee Device: GE Logiq E  Verbal informed consent  obtained.  Time-out conducted.  Noted no overlying erythema, induration, or other signs of local infection.  Skin prepped in a sterile fashion.  Local anesthesia: Topical Ethyl chloride.  With sterile technique and under real time ultrasound guidance:  1 mL kenalog 40, 2 mL lidocaine, 2 mL Marcaine injected easily from a parapatellar approach. Completed without difficulty  Pain immediately resolved suggesting accurate placement of the medication.  Advised to call if fevers/chills, erythema, induration, drainage, or persistent bleeding.  Images permanently stored and available for review in the ultrasound unit.  Impression: Technically successful ultrasound guided injection.  Impression and Recommendations:   This case required medical decision making of moderate complexity.

## 2015-12-11 NOTE — Assessment & Plan Note (Signed)
Starting phentermine, return monthly for weight checks and refills, he will also establish with one of our primary care providers.

## 2016-01-08 ENCOUNTER — Telehealth: Payer: Self-pay | Admitting: Family Medicine

## 2016-01-08 ENCOUNTER — Ambulatory Visit (INDEPENDENT_AMBULATORY_CARE_PROVIDER_SITE_OTHER): Payer: 59 | Admitting: Sports Medicine

## 2016-01-08 ENCOUNTER — Encounter: Payer: Self-pay | Admitting: Sports Medicine

## 2016-01-08 DIAGNOSIS — R635 Abnormal weight gain: Secondary | ICD-10-CM | POA: Diagnosis not present

## 2016-01-08 DIAGNOSIS — M1711 Unilateral primary osteoarthritis, right knee: Secondary | ICD-10-CM

## 2016-01-08 MED ORDER — PHENTERMINE HCL 37.5 MG PO TABS: ORAL_TABLET | ORAL | 0 refills | Status: DC

## 2016-01-08 NOTE — Progress Notes (Signed)
  Subjective:    CC: Followup  HPI: This is a pleasant 50 year old male, he's lost 8 pounds on the first month of phentermine.  Right knee osteoarthritis: Pain has all but resolved after injection, happy with results.  Past medical history, Surgical history, Family history not pertinant except as noted below, Social history, Allergies, and medications have been entered into the medical record, reviewed, and no changes needed.   Review of Systems: No fevers, chills, night sweats, weight loss, chest pain, or shortness of breath.   Objective:    General: Well Developed, well nourished, and in no acute distress.  Neuro: Alert and oriented x3, extra-ocular muscles intact, sensation grossly intact.  HEENT: Normocephalic, atraumatic, pupils equal round reactive to light, neck supple, no masses, no lymphadenopathy, thyroid nonpalpable.  Skin: Warm and dry, no rashes. Cardiac: Regular rate and rhythm, no murmurs rubs or gallops, no lower extremity edema.  Respiratory: Clear to auscultation bilaterally. Not using accessory muscles, speaking in full sentences.  Impression and Recommendations:    Primary osteoarthritis of right knee Pain is all but resolved after injection.  Abnormal weight gain 8 lb weight loss after the first month. Refilling medication as we enter the 2nd month of phentermine. Return in 1 month for weight check with PCP.

## 2016-01-08 NOTE — Assessment & Plan Note (Signed)
Pain is all but resolved after injection.

## 2016-01-08 NOTE — Telephone Encounter (Signed)
Pt wants to know if you would accept him as your patient for primary care.   Thank you.

## 2016-01-08 NOTE — Assessment & Plan Note (Signed)
8 lb weight loss after the first month. Refilling medication as we enter the 2nd month of phentermine. Return in 1 month for weight check with PCP.

## 2016-01-09 NOTE — Telephone Encounter (Signed)
OK 

## 2016-11-07 DIAGNOSIS — H00025 Hordeolum internum left lower eyelid: Secondary | ICD-10-CM | POA: Diagnosis not present

## 2017-03-12 ENCOUNTER — Emergency Department
Admission: EM | Admit: 2017-03-12 | Discharge: 2017-03-12 | Disposition: A | Discharge status: Home / Self Care | Payer: 59 | Source: Home / Self Care | Attending: Internal Medicine | Admitting: Internal Medicine

## 2017-03-12 ENCOUNTER — Encounter: Payer: Self-pay | Admitting: Physician Assistant

## 2017-03-12 DIAGNOSIS — J029 Acute pharyngitis, unspecified: Secondary | ICD-10-CM

## 2017-03-12 MED ORDER — PHENOL 1.4 % MT LIQD: 1.0000 | OROMUCOSAL | 0 refills | Status: DC | PRN

## 2017-03-12 MED ORDER — FEXOFENADINE-PSEUDOEPHED ER 60-120 MG PO TB12: 1.0000 | ORAL_TABLET | Freq: Two times a day (BID) | ORAL | 0 refills | Status: DC

## 2017-03-12 NOTE — Discharge Instructions (Signed)
Rapid strep negative. Symptoms are most likely due to viral illness. Start phenol for sore throat. Flonase and/or Allegra-D for nasal congestion. You can use over the counter nasal saline rinse such as neti pot for nasal congestion. Monitor for any worsening of symptoms, swelling of the throat, trouble breathing, trouble swallowing, follow up for reevaluation.   For sore throat try using a honey-based tea. Use 3 teaspoons of honey with juice squeezed from half lemon. Place shaved pieces of ginger into 1/2-1 cup of water and warm over stove top. Then mix the ingredients and repeat every 4 hours as needed.

## 2017-03-12 NOTE — ED Triage Notes (Signed)
Patient c/o sore throat and sweats x 2 days.

## 2017-03-12 NOTE — ED Provider Notes (Signed)
[facility]    CSN: [id number] Arrival date & time: 03/12/17  2423     History   Chief Complaint Chief Complaint  Patient presents with  . Sore Throat    HPI Christian Mata is a 51 y.o. male.   51 year old male with history of osteoarthritis, tonsillectomy and adenoidectomy comes in for 2 day history of sore throat and sweats. Minimal cough with mild nasal congestion. Admits to dysphagia. Denies fever, chills. Denies ear pain, eye pain. Denies trouble breathing, trouble swallowing, swelling of the throat. Patient takes allegra and flonase to help with seasonal allergies and congestion. Has not tried anything else for the symptoms. Denies sick contact. Never smoker, though was passive smoker growing up.       History reviewed. No pertinent past medical history.  Patient Active Problem List   Diagnosis Date Noted  . Primary osteoarthritis of right knee 12/11/2015  . Abnormal weight gain 12/11/2015    Past Surgical History:  Procedure Laterality Date  . TONSILLECTOMY AND ADENOIDECTOMY         Home Medications    Prior to Admission medications   Medication Sig Start Date End Date Taking? Authorizing Provider  fexofenadine (ALLEGRA) 30 MG tablet Take 30 mg by mouth 2 (two) times daily.   Yes [provider]  fexofenadine-pseudoephedrine (ALLEGRA-D) 60-120 MG 12 hr tablet Take 1 tablet by mouth 2 (two) times daily. 03/12/17   Tasia Catchings, Karolynn Infantino V, PA-C  phenol (CHLORASEPTIC) 1.4 % LIQD Use as directed 1 spray in the mouth or throat as needed for throat irritation / pain. 03/12/17   Ok Edwards, PA-C    Family History Family History  Problem Relation Age of Onset  . Cancer Mother        Lung  . Cancer Father        brain  . Cancer Other        bone    Social History Social History  Substance Use Topics  . Smoking status: Never Smoker  . Smokeless tobacco: Never Used  . Alcohol use 1.2 oz/week    2 Shots of liquor per week     Allergies     Patient has no known allergies.   Review of Systems Review of Systems  Reason unable to perform ROS: See HPI as above.     Physical Exam Triage Vital Signs ED Triage Vitals [03/12/17 0905]  Enc Vitals Group     BP 138/75     Pulse Rate (!) 58     Resp 14     Temp 98.2 F (36.8 C)     Temp Source Oral     SpO2 96 %     Weight 225 lb (102.1 kg)     Height      Head Circumference      Peak Flow      Pain Score 5     Pain Loc      Pain Edu?      Excl. in [place]?    No data found.   Updated Vital Signs BP 138/75 (BP Location: Left Arm)   Pulse (!) 58   Temp 98.2 F (36.8 C) (Oral)   Resp 14   Wt 225 lb (102.1 kg)   SpO2 96%   BMI 33.23 kg/m   Physical Exam  Constitutional: He is oriented to person, place, and time. He appears well-developed and well-nourished. No distress.  HENT:  Head: Normocephalic and atraumatic.  Right Ear:  Tympanic membrane, external ear and ear canal normal. Tympanic membrane is not erythematous and not bulging.  Left Ear: External ear and ear canal normal. Tympanic membrane is erythematous. Tympanic membrane is not bulging.  Nose: Mucosal edema and rhinorrhea present. Right sinus exhibits no maxillary sinus tenderness and no frontal sinus tenderness. Left sinus exhibits no maxillary sinus tenderness and no frontal sinus tenderness.  Mouth/Throat: Uvula is midline and mucous membranes are normal. Posterior oropharyngeal erythema present.  Eyes: Pupils are equal, round, and reactive to light. Conjunctivae are normal.  Neck: Normal range of motion. Neck supple.  Cardiovascular: Normal rate, regular rhythm and normal heart sounds.  Exam reveals no gallop and no friction rub.   No murmur heard. Pulmonary/Chest: Effort normal and breath sounds normal. He has no decreased breath sounds. He has no wheezes. He has no rhonchi. He has no rales.  Lymphadenopathy:    He has no cervical adenopathy.  Neurological: He is alert and oriented to person, place,  and time.  Skin: Skin is warm and dry.  Psychiatric: He has a normal mood and affect. His behavior is normal. Judgment normal.     UC Treatments / Results  Labs (all labs ordered are listed, but only abnormal results are displayed) Labs Reviewed  STREP A DNA PROBE    EKG  EKG Interpretation None       Radiology No results found.  Procedures Procedures (including critical care time)  Medications Ordered in UC Medications - No data to display   Initial Impression / Assessment and Plan / UC Course  I have reviewed the triage vital signs and the nursing notes.  Pertinent labs & imaging results that were available during my care of the patient were reviewed by me and considered in my medical decision making (see chart for details).    Rapid strep negative. Symptomatic treatment as needed. Return precautions given.   Final Clinical Impressions(s) / UC Diagnoses   Final diagnoses:  Acute pharyngitis, unspecified etiology    New Prescriptions New Prescriptions   FEXOFENADINE-PSEUDOEPHEDRINE (ALLEGRA-D) 60-120 MG 12 HR TABLET    Take 1 tablet by mouth 2 (two) times daily.   PHENOL (CHLORASEPTIC) 1.4 % LIQD    Use as directed 1 spray in the mouth or throat as needed for throat irritation / pain.      Ok Edwards, PA-C 03/12/17 [phone]

## 2017-03-13 ENCOUNTER — Telehealth: Payer: Self-pay | Admitting: Emergency Medicine

## 2017-03-13 LAB — STREP A DNA PROBE: GROUP A STREP PROBE: NOT DETECTED

## 2017-05-27 DIAGNOSIS — Z0000 Encounter for general adult medical examination without abnormal findings: Secondary | ICD-10-CM | POA: Diagnosis not present

## 2017-07-27 DIAGNOSIS — Z1211 Encounter for screening for malignant neoplasm of colon: Secondary | ICD-10-CM | POA: Diagnosis not present

## 2017-07-27 DIAGNOSIS — D126 Benign neoplasm of colon, unspecified: Secondary | ICD-10-CM | POA: Diagnosis not present

## 2017-08-20 ENCOUNTER — Ambulatory Visit: Payer: 59 | Admitting: Allergy & Immunology

## 2017-08-20 ENCOUNTER — Encounter: Payer: Self-pay | Admitting: Allergy & Immunology

## 2017-08-20 VITALS — BP 128/74 | HR 84 | Resp 20 | Ht 68.75 in | Wt 236.8 lb

## 2017-08-20 DIAGNOSIS — J4540 Moderate persistent asthma, uncomplicated: Secondary | ICD-10-CM

## 2017-08-20 DIAGNOSIS — K219 Gastro-esophageal reflux disease without esophagitis: Secondary | ICD-10-CM | POA: Diagnosis not present

## 2017-08-20 DIAGNOSIS — J310 Chronic rhinitis: Secondary | ICD-10-CM | POA: Diagnosis not present

## 2017-08-20 MED ORDER — FLUTICASONE PROPIONATE 50 MCG/ACT NA SUSP: 1.0000 | Freq: Every day | NASAL | 5 refills | Status: DC

## 2017-08-20 MED ORDER — FEXOFENADINE HCL 180 MG PO TABS: 180.0000 mg | ORAL_TABLET | Freq: Every day | ORAL | 5 refills | Status: DC

## 2017-08-20 MED ORDER — OMEPRAZOLE 20 MG PO CPDR: 20.0000 mg | DELAYED_RELEASE_CAPSULE | Freq: Every day | ORAL | 5 refills | Status: DC

## 2017-08-20 MED ORDER — AZELASTINE HCL 0.1 % NA SOLN: 2.0000 | Freq: Two times a day (BID) | NASAL | 5 refills | Status: DC

## 2017-08-20 MED ORDER — BUDESONIDE-FORMOTEROL FUMARATE 160-4.5 MCG/ACT IN AERO: 2.0000 | INHALATION_SPRAY | Freq: Two times a day (BID) | RESPIRATORY_TRACT | 5 refills | Status: DC

## 2017-08-20 MED ORDER — ALBUTEROL SULFATE HFA 108 (90 BASE) MCG/ACT IN AERS: 2.0000 | INHALATION_SPRAY | RESPIRATORY_TRACT | 3 refills | Status: DC | PRN

## 2017-08-20 NOTE — Patient Instructions (Addendum)
1. Moderate persistent asthma, uncomplicated - Lung testing looked terrible, but it did improve with the nebulizer treatment.  - We will start Symbicort 160/4.5 two puffs twice daily as a controller medication. - Start the prednisone pack provided. - Spacer sample and demonstration provided. - Daily controller medication(s): Symbicort 160/4.82mcg two puffs twice daily with spacer - Prior to physical activity: ProAir 2 puffs 10-15 minutes before physical activity. - Rescue medications: ProAir 4 puffs every 4-6 hours as needed - Asthma control goals:  * Full participation in all desired activities (may need albuterol before activity) * Albuterol use two time or less a week on average (not counting use with activity) * Cough interfering with sleep two time or less a month * Oral steroids no more than once a year * No hospitalizations  2. Chronic rhinitis - We will get some lab work to check on your allergy levels.  - Avoidance measures provided. - Continue with: Allegra (fexofenadine) 180mg  table once daily and Flonase (fluticasone) one spray per nostril daily - Start taking: Astelin (azelastine) 2 sprays per nostril 1-2 times daily as needed - You can use an extra dose of the antihistamine, if needed, for breakthrough symptoms.  - Consider nasal saline rinses 1-2 times daily to remove allergens from the nasal cavities as well as help with mucous clearance (this is especially helpful to do before the nasal sprays are given)  3. Gastroesophageal reflux disease - I would recommend that you take omeprazole 20mg  daily to see if this help with the night time wheezing.  - We should know by the next visit whether this is working or not.  - We can always back down if needed.  4. Return in about 2 months (around 10/20/2017).   Please inform us of any Emergency Department visits, hospitalizations, or changes in symptoms. Call us before going to the ED for breathing or allergy symptoms since we might  be able to fit you in for a sick visit. Feel free to contact us anytime with any questions, problems, or concerns.  It was a pleasure to meet you today!  Websites that have reliable patient information: 1. American Academy of Asthma, Allergy, and Immunology: www.aaaai.org 2. Food Allergy Research and Education (FARE): foodallergy.org 3. Mothers of Asthmatics: http://www.asthmacommunitynetwork.org 4. American College of Allergy, Asthma, and Immunology: www.acaai.org

## 2017-08-20 NOTE — Progress Notes (Signed)
FOLLOW UP  Date of Service/Encounter:  08/20/17   Assessment:   Moderate persistent asthma, uncomplicated  Chronic perennial allergic rhinitis (indoor molds)  Gastroesophageal reflux disease - with some improvement with Rolaids   Asthma Reportables:  Severity: moderate persistent  Risk: high Control: not well controlled  Plan/Recommendations:   1. Moderate persistent asthma, uncomplicated - Lung testing looked terrible, but it did improve with the nebulizer treatment.  - We will start Symbicort 160/4.5 two puffs twice daily as a controller medication. - Start the prednisone pack provided. - Spacer sample and demonstration provided. - Daily controller medication(s): Symbicort 160/4.58mcg two puffs twice daily with spacer - Prior to physical activity: ProAir 2 puffs 10-15 minutes before physical activity. - Rescue medications: ProAir 4 puffs every 4-6 hours as needed - Asthma control goals:  * Full participation in all desired activities (may need albuterol before activity) * Albuterol use two time or less a week on average (not counting use with activity) * Cough interfering with sleep two time or less a month * Oral steroids no more than once a year * No hospitalizations  2. Chronic rhinitis - We will get some lab work to check on your allergy levels.  - Avoidance measures provided. - Continue with: Allegra (fexofenadine) 180mg  table once daily and Flonase (fluticasone) one spray per nostril daily - Start taking: Astelin (azelastine) 2 sprays per nostril 1-2 times daily as needed - You can use an extra dose of the antihistamine, if needed, for breakthrough symptoms.  - Consider nasal saline rinses 1-2 times daily to remove allergens from the nasal cavities as well as help with mucous clearance (this is especially helpful to do before the nasal sprays are given)  3. Gastroesophageal reflux disease - I would recommend that you take omeprazole 20mg  daily to see if this  help with the night time wheezing.  - We should know by the next visit whether this is working or not.  - We can always back down if needed.  4. Return in about 2 months (around 10/20/2017).  Subjective:   Christian Mata is a 52 y.o. male presenting today for follow up of  Chief Complaint  Patient presents with  . Allergic Rhinitis     Christian Mata has a history of the following: Patient Active Problem List   Diagnosis Date Noted  . Primary osteoarthritis of right knee 12/11/2015  . Abnormal weight gain 12/11/2015    History obtained from: chart review and patient.  Christian Mata's Primary Care Provider is Patient, No Pcp Per.     Christian Mata is a 52 y.o. male presenting to re-establish care with our practice. He was last seen in May 2016 by Dr. Ishmael Holter, who has since left the practice. At that time, he was doing well on Dulera 200/5 two puffs BID with plans to decrease to 1 puff BID once the spring season was over. He was continued on Allegra 180mg  one tablet once daily as well as Astepro 1-2 sprays per nostril BID after nasal saline lavage. He was asked to follow up in 4-6 months, but presents now neatly three years later.   In the interim, he has continued to have symptoms but has never gotten around to making it back to our practice for a visit. Typically he only has symptoms in the spring, but last year his symptoms seemed to last longer.    Asthma/Respiratory Symptom History: He reports that his breathing is always worse around this time of  the year. He was recently in [facility] and ended up having some problems with breathing. He did dig up a Dulera inhaler that was rather out of date. He tends to have problems in the spring and the fall. Weather changes bother him. He develops some problems with postnasal drip.   Allergic Rhinitis Symptom History: Currently he is using a nasal lavage, a nasal decongestant, and an antihistamine as needed (Allegra) and Flonase as needed. Last  testing was performed in 2007 and was positive only to a minor mold mix on intradermalds.  He does have a history of GERD and uses honey or Rolaids to help with this. BBQ sauce seems to mess him up per the patient. GERD symptoms have been worsening over time.   Otherwise, there have been no changes to his past medical history, surgical history, family history, or social history.    Review of Systems: a 14-point review of systems is pertinent for what is mentioned in HPI.  Otherwise, all other systems were negative. Constitutional: negative other than that listed in the HPI Eyes: negative other than that listed in the HPI Ears, nose, mouth, throat, and face: negative other than that listed in the HPI Respiratory: negative other than that listed in the HPI Cardiovascular: negative other than that listed in the HPI Gastrointestinal: negative other than that listed in the HPI Genitourinary: negative other than that listed in the HPI Integument: negative other than that listed in the HPI Hematologic: negative other than that listed in the HPI Musculoskeletal: negative other than that listed in the HPI Neurological: negative other than that listed in the HPI Allergy/Immunologic: negative other than that listed in the HPI    Objective:   Blood pressure 128/74, pulse 84, resp. rate 20, height 5' 8.75" (1.746 m), weight 236 lb 12.8 oz (107.4 kg). Body mass index is 35.22 kg/m.   Physical Exam:  General: Alert, interactive, in no acute distress. Pleasant male. Boisterous.  Eyes: No conjunctival injection bilaterally, no discharge on the right, no discharge on the left and no Horner-Trantas dots present. PERRL bilaterally. EOMI without pain. No photophobia.  Ears: Right TM pearly gray with normal light reflex, Left TM pearly gray with normal light reflex, Right TM intact without perforation and Left TM intact without perforation.  Nose/Throat: External nose within normal limits and septum  midline. Turbinates edematous and pale with clear discharge. Posterior oropharynx erythematous without cobblestoning in the posterior oropharynx. Tonsils 2+ without exudates.  Tongue without thrush. Lungs: Decreased breath sounds bilaterally without wheezing, rhonchi or rales. No increased work of breathing. CV: Normal S1/S2. No murmurs. Capillary refill <2 seconds.  Skin: Warm and dry, without lesions or rashes. Neuro:   Grossly intact. No focal deficits appreciated. Responsive to questions.  Diagnostic studies:   Spirometry: results abnormal (FEV1: 2.44/65%, FVC: 3.15/66%, FEV1/FVC: 77%).    Spirometry consistent with possible restrictive disease. Albuterol/Atrovent nebulizer treatment given in clinic with significant improvement in FEV1 per ATS criteria. Prednisone burst initiated today.    Allergy Studies: none     Salvatore Marvel, MD [facility] [place]

## 2017-08-25 LAB — IGE+ALLERGENS ZONE 2(30)
Alternaria Alternata IgE: <0.1 kU/L
Bermuda Grass IgE: <0.1 kU/L
Cedar, Mountain IgE: <0.1 kU/L
Cladosporium Herbarum IgE: <0.1 kU/L
Common Silver Birch IgE: <0.1 kU/L
D Farinae IgE: <0.1 kU/L
D Pteronyssinus IgE: <0.1 kU/L
Dog Dander IgE: <0.1 kU/L
E001-IGE CAT DANDER: 0.19 kU/L — AB
Hickory, White IgE: <0.1 kU/L
Johnson Grass IgE: <0.1 kU/L
Mucor Racemosus IgE: <0.1 kU/L
Mugwort IgE Qn: <0.1 kU/L
Nettle IgE: <0.1 kU/L
Oak, White IgE: <0.1 kU/L
Plantain, English IgE: <0.1 kU/L
Ragweed, Short IgE: <0.1 kU/L
Stemphylium Herbarum IgE: <0.1 kU/L
Sweet gum IgE RAST Ql: <0.1 kU/L

## 2017-08-25 LAB — CBC WITH DIFFERENTIAL/PLATELET
Basophils Absolute: 0 10*3/uL (ref 0.0–0.2)
Basos: 0 %
EOS (ABSOLUTE): 0.1 10*3/uL (ref 0.0–0.4)
Eos: 1 %
HEMOGLOBIN: 14.8 g/dL (ref 13.0–17.7)
Hematocrit: 42.9 % (ref 37.5–51.0)
IMMATURE GRANULOCYTES: 0 %
Immature Grans (Abs): 0 10*3/uL (ref 0.0–0.1)
LYMPHS: 41 %
Lymphocytes Absolute: 2.8 10*3/uL (ref 0.7–3.1)
MCH: 29.8 pg (ref 26.6–33.0)
MCHC: 34.5 g/dL (ref 31.5–35.7)
MCV: 86 fL (ref 79–97)
MONOCYTES: 8 %
Monocytes Absolute: 0.5 10*3/uL (ref 0.1–0.9)
NEUTROS ABS: 3.4 10*3/uL (ref 1.4–7.0)
Neutrophils: 50 %
PLATELETS: 233 10*3/uL (ref 150–379)
RBC: 4.97 x10E6/uL (ref 4.14–5.80)
RDW: 13.1 % (ref 12.3–15.4)
WBC: 6.8 10*3/uL (ref 3.4–10.8)

## 2018-01-26 IMAGING — DX DG KNEE COMPLETE 4+V*R*
4 series · 4 of 4 positions shown · non-contrast
Comparison: None.

CLINICAL DATA: 50-year-old male with chronic right knee pain for
years more medially. Left knee for comparison. Initial encounter.

EXAM:
RIGHT KNEE - COMPLETE 4+ VIEW; LEFT KNEE - 1-2 VIEW

[tunnel]
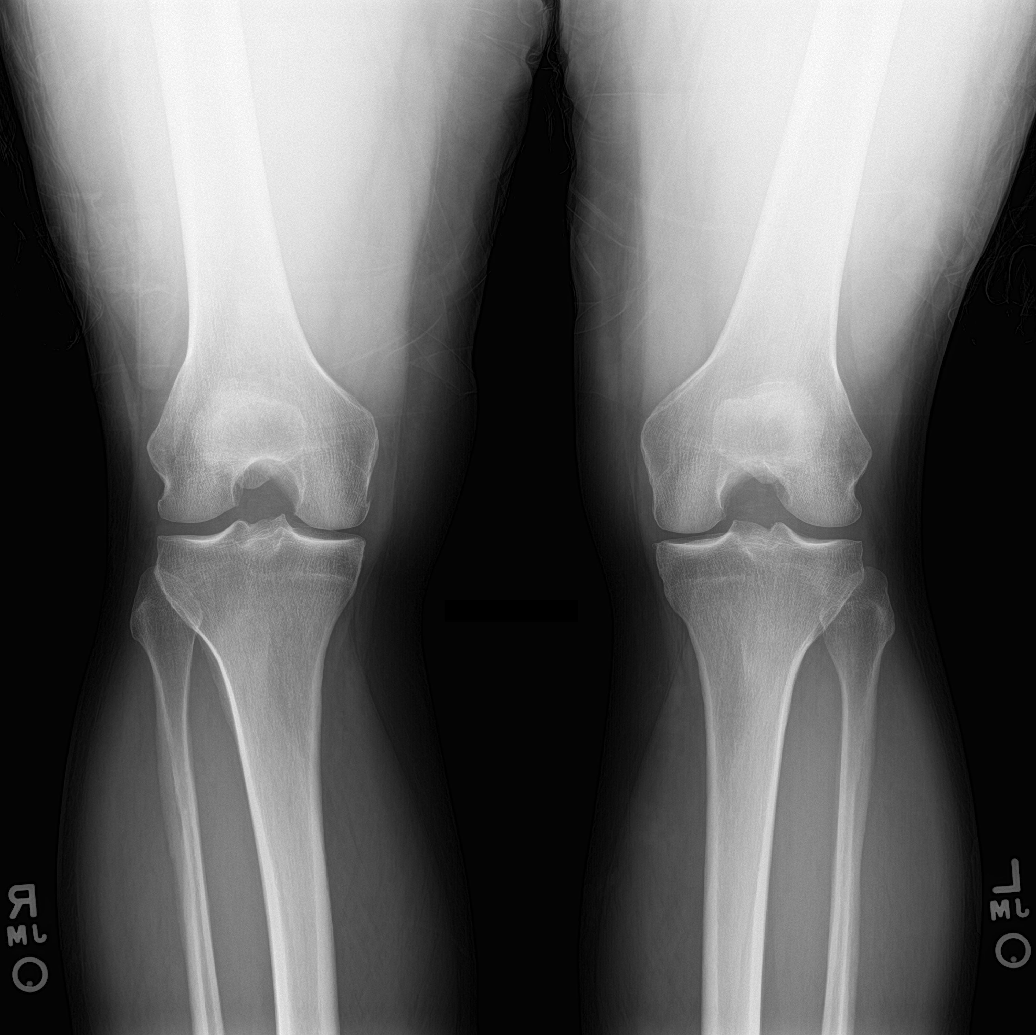

[knee lat]
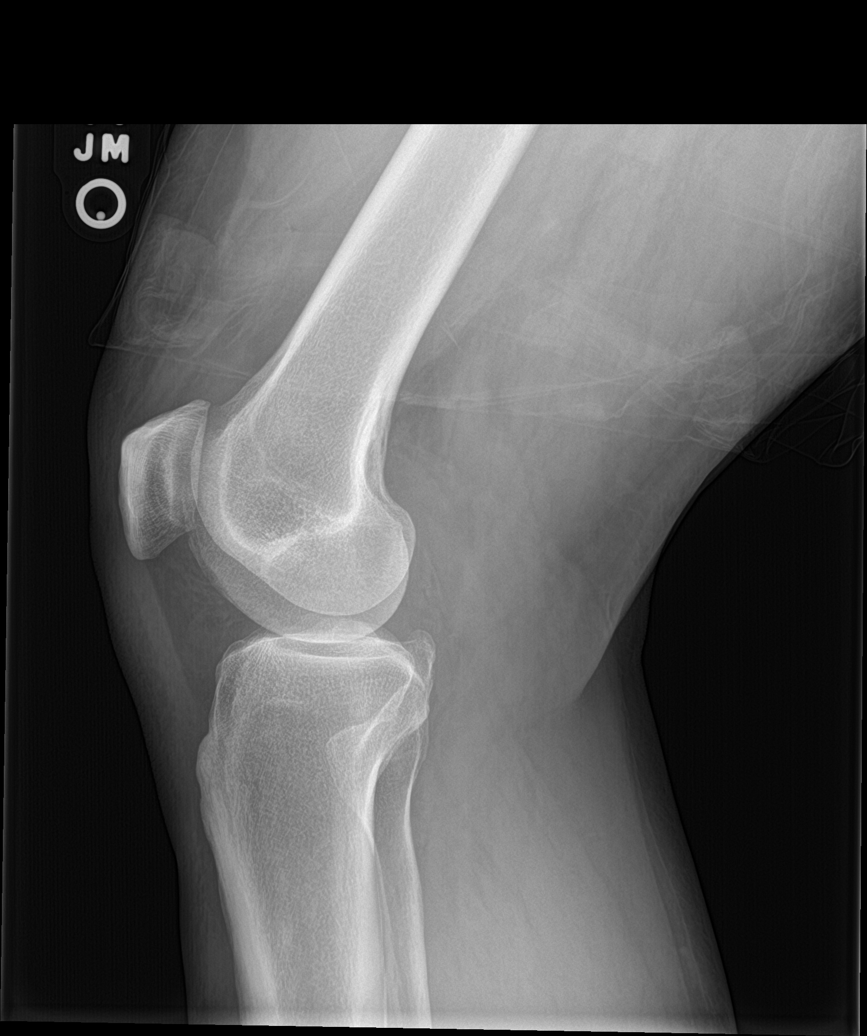

[knee sunrise]
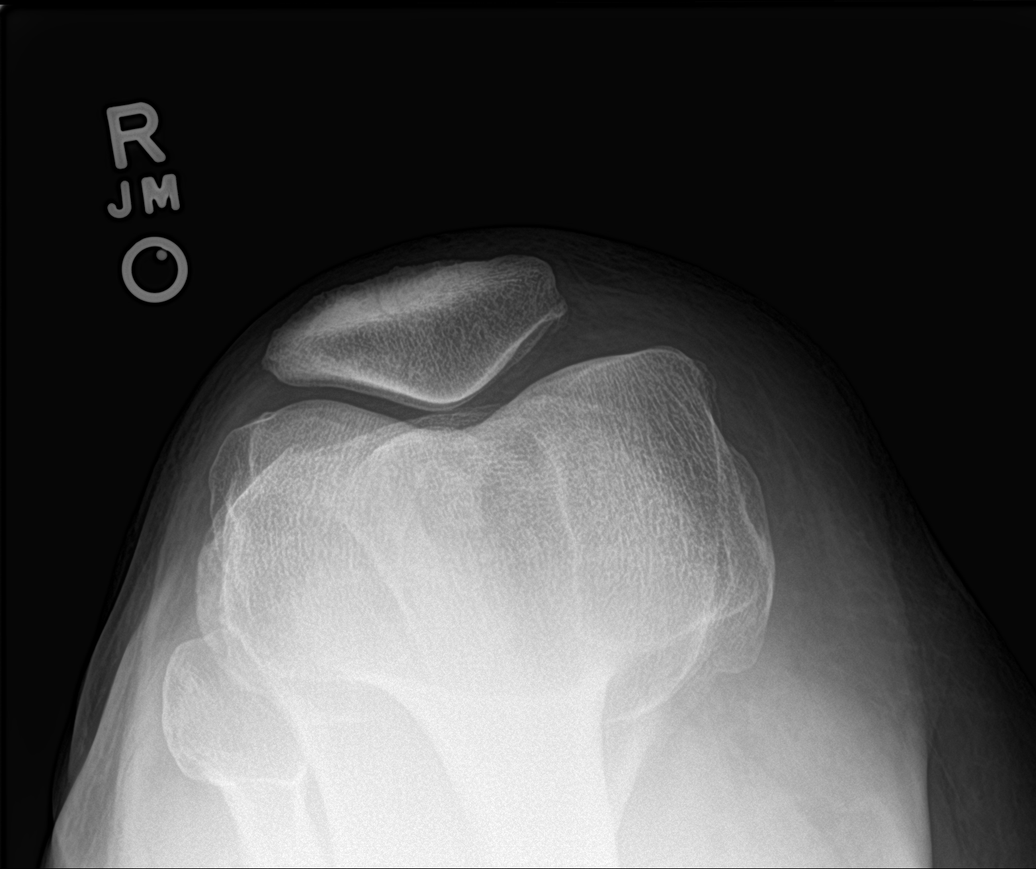

[knee ap bilat standing]
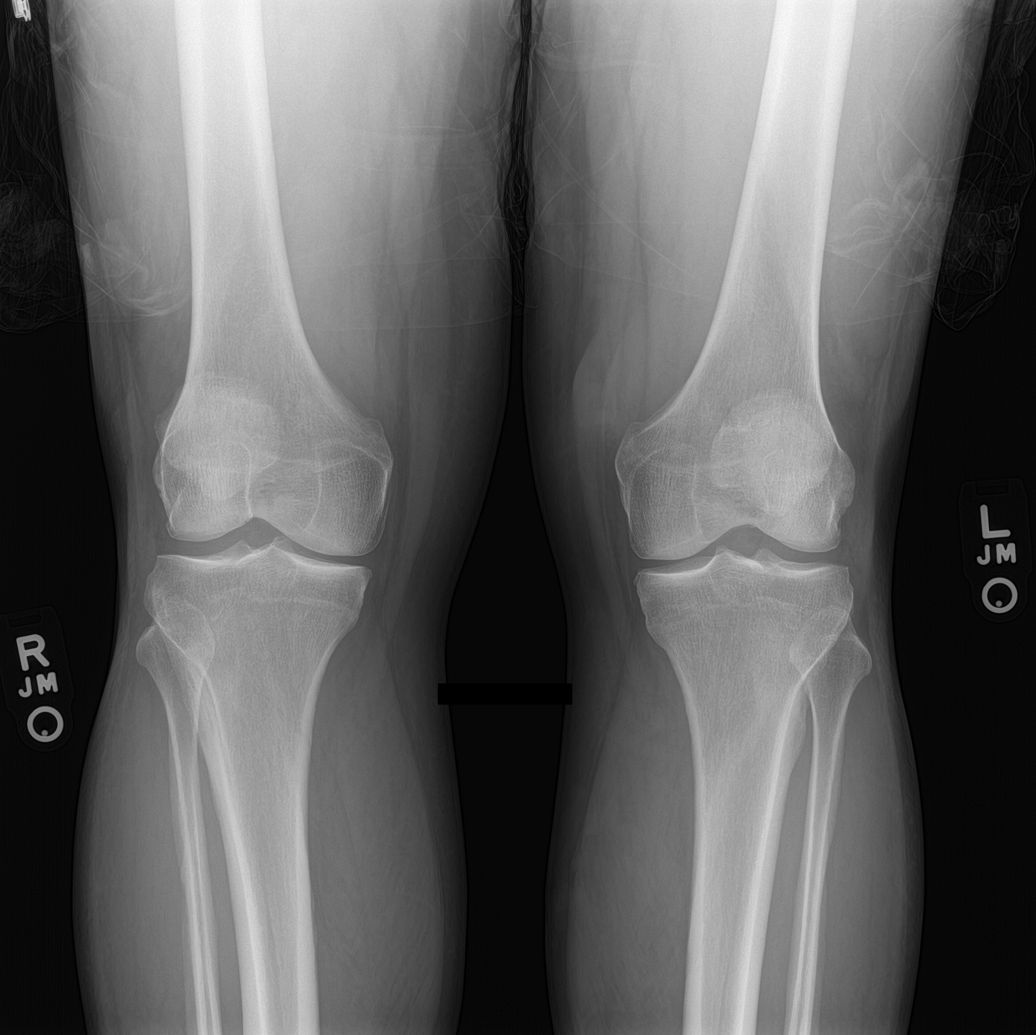

[4 of 4 positions shown; findings below may reference images not displayed]

FINDINGS: Right knee four views:

Minimal medial tibial femoral joint space narrowing. Otherwise
negative exam.

Left knee two views:  Negative.
IMPRESSION: Minimal right medial tibial femoral joint space narrowing.

## 2018-03-18 ENCOUNTER — Other Ambulatory Visit: Payer: Self-pay | Admitting: Allergy & Immunology

## 2018-05-02 ENCOUNTER — Other Ambulatory Visit: Payer: Self-pay | Admitting: Allergy & Immunology

## 2018-05-06 ENCOUNTER — Other Ambulatory Visit: Payer: Self-pay | Admitting: Allergy & Immunology

## 2018-05-14 ENCOUNTER — Other Ambulatory Visit: Payer: Self-pay | Admitting: *Deleted

## 2018-06-05 ENCOUNTER — Other Ambulatory Visit: Payer: Self-pay | Admitting: Allergy & Immunology

## 2018-07-20 ENCOUNTER — Ambulatory Visit (INDEPENDENT_AMBULATORY_CARE_PROVIDER_SITE_OTHER): Payer: 59 | Admitting: Allergy & Immunology

## 2018-07-20 ENCOUNTER — Encounter: Payer: Self-pay | Admitting: Allergy & Immunology

## 2018-07-20 ENCOUNTER — Other Ambulatory Visit: Payer: Self-pay

## 2018-07-20 VITALS — BP 110/70 | HR 51 | Resp 16 | Ht 69.0 in | Wt 219.6 lb

## 2018-07-20 DIAGNOSIS — K219 Gastro-esophageal reflux disease without esophagitis: Secondary | ICD-10-CM

## 2018-07-20 DIAGNOSIS — J4541 Moderate persistent asthma with (acute) exacerbation: Secondary | ICD-10-CM | POA: Diagnosis not present

## 2018-07-20 DIAGNOSIS — J310 Chronic rhinitis: Secondary | ICD-10-CM

## 2018-07-20 MED ORDER — FLUTICASONE PROPIONATE 50 MCG/ACT NA SUSP: 1.0000 | Freq: Every day | NASAL | 5 refills | Status: DC

## 2018-07-20 MED ORDER — BUDESONIDE-FORMOTEROL FUMARATE 160-4.5 MCG/ACT IN AERO: INHALATION_SPRAY | RESPIRATORY_TRACT | 0 refills | Status: DC

## 2018-07-20 NOTE — Addendum Note (Signed)
Addended by: Valere Dross on: 07/20/2018 05:55 PM   Modules accepted: Orders

## 2018-07-20 NOTE — Progress Notes (Signed)
FOLLOW UP  Date of Service/Encounter:  07/20/18   Assessment:   Moderate persistent asthma with acute exacerbation  Chronic perennial allergic rhinitis (indoor molds)  Gastroesophageal reflux disease    Asthma Reportables:  Severity: moderate persistent  Risk: high Control: not well controlled   Pavan presents for a sick visit.  His lung function is awful with values in the 50th percentile, but they do improve to nearly 70% and 80% respectively following an albuterol treatment.  We are starting him on a prednisone burst.  I recommended that he restart his Symbicort and we did provide him with a sample.  We also provided a co-pay card as well.  I emphasized the need for regular follow-ups for the best control of his asthma.  Although he reports that he just got out of his Symbicort a month ago, I doubt this will be possible since it is been nearly a year since I saw him.  We have worked him up for the addition of Biologics in the past, and he never qualified for Xolair or an anti-IL-5 agent.  Right.   Plan/Recommendations:   1. Moderate persistent asthma, uncomplicated - Lung testing looked terrible, but it did improve with the nebulizer treatment.  - Restart the Symbicort 160/4.5 two puffs twice daily as a controller medication. - Start the prednisone pack provided. - Daily controller medication(s): Symbicort 160/4.80mcg two puffs twice daily with spacer - Prior to physical activity: ProAir 2 puffs 10-15 minutes before physical activity. - Rescue medications: ProAir 4 puffs every 4-6 hours as needed - Asthma control goals:  * Full participation in all desired activities (may need albuterol before activity) * Albuterol use two time or less a week on average (not counting use with activity) * Cough interfering with sleep two time or less a month * Oral steroids no more than once a year * No hospitalizations  2. Chronic rhinitis - Continue with: Flonase (fluticasone) one  spray per nostril daily and Astelin (azelastine) 2 sprays per nostril 1-2 times daily as needed - You can use an extra dose of the antihistamine, if needed, for breakthrough symptoms.  - Consider nasal saline rinses 1-2 times daily to remove allergens from the nasal cavities as well as help with mucous clearance (this is especially helpful to do before the nasal sprays are given)  3. Return in about 6 months (around 01/18/2019).  Subjective:   BUNYAN BRIER is a 53 y.o. male presenting today for follow up of  Chief Complaint  Patient presents with  . Nasal Congestion  . Asthma    THEODORO KOVAL has a history of the following: Patient Active Problem List   Diagnosis Date Noted  . Primary osteoarthritis of right knee 12/11/2015  . Abnormal weight gain 12/11/2015    History obtained from: chart review and patient.  Briana is a 53 y.o. male presenting for a follow up visit.  He was last seen in March 2019.  At that time, his lung testing looked terrible.  It did improve with a nebulizer treatment.  We started him on Symbicort 160/4.5 mcg 2 puffs twice daily.  I also started him on a prednisone Dosepak.  For his rhinitis, we obtained labs to look at environmental allergies.  We continued him on Allegra and Flonase.  We started Astelin as needed.  For his reflux, we started him on omeprazole 20 mg daily to see if this helped with his nighttime wheezing.  We did obtain a CBC which was  notable for normal eosinophil count.  An environmental allergen panel demonstrated an IgE level less than 2.  The only elevated level on his panel was cat at 0.19.  Asthma/Respiratory Symptom History: He has been out of hius Symbicort for around one month. He has not needed prednisone since he saw [place] last time. He was using his Symbicort twice daily.   Allergic Rhinitis Symptom History: He has beenm surviving on the Flonase and the Astelin. He stopped the Allegra. He has not needed antibiotics at all since the  last visit. He has been taking a wide variety of vitamins and minerals.  Otherwise, there have been no changes to his past medical history, surgical history, family history, or social history. His wife has recently been diagnosed with Hashimoto's thyroiditis. She had breast cancer (ductal carcinoma) in 2013.  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    Review of Systems  Constitutional: Negative.  Negative for fever, malaise/fatigue and weight loss.  HENT: Negative.  Negative for congestion, ear discharge and ear pain.   Eyes: Negative for pain, discharge and redness.  Respiratory: Negative for cough, sputum production, shortness of breath and wheezing.   Cardiovascular: Negative.  Negative for chest pain and palpitations.  Gastrointestinal: Negative for abdominal pain and heartburn.  Skin: Negative.  Negative for itching and rash.  Neurological: Negative for dizziness and headaches.  Endo/Heme/Allergies: Negative for environmental allergies. Does not bruise/bleed easily.       Objective:   Blood pressure 110/70, pulse (!) 51, resp. rate 16, height 5\' 9"  (1.753 m), weight 219 lb 9.6 oz (99.6 kg), SpO2 96 %. Body mass index is 32.43 kg/m.   Physical Exam:  Physical Exam  Constitutional: He appears well-developed.  HENT:  Head: Normocephalic and atraumatic.  Right Ear: Tympanic membrane, external ear and ear canal normal. No drainage, swelling or tenderness. Tympanic membrane is not injected, not scarred, not erythematous, not retracted and not bulging.  Left Ear: Tympanic membrane, external ear and ear canal normal. No drainage, swelling or tenderness. Tympanic membrane is not injected, not scarred, not erythematous, not retracted and not bulging.  Nose: No mucosal edema, rhinorrhea, nasal deformity or septal deviation. No epistaxis. Right sinus exhibits no maxillary sinus tenderness and no frontal sinus tenderness. Left sinus exhibits no maxillary sinus  tenderness and no frontal sinus tenderness.  Mouth/Throat: Uvula is midline and oropharynx is clear and moist. Mucous membranes are not pale and not dry.  Eyes: Pupils are equal, round, and reactive to light. Conjunctivae and EOM are normal. Right eye exhibits no chemosis and no discharge. Left eye exhibits no chemosis and no discharge. Right conjunctiva is not injected. Left conjunctiva is not injected.  Cardiovascular: Normal rate, regular rhythm and normal heart sounds.  Respiratory: Effort normal. No accessory muscle usage. No tachypnea. No respiratory distress. He has no wheezes. He has no rhonchi. He has no rales. He exhibits no tenderness.  Decreased air movement at the bases.  GI: There is no abdominal tenderness. There is no rebound and no guarding.  Lymphadenopathy:       Head (right side): No submandibular, no tonsillar and no occipital adenopathy present.       Head (left side): No submandibular, no tonsillar and no occipital adenopathy present.    He has no cervical adenopathy.  Neurological: He is alert.  Skin: No abrasion, no petechiae and no rash noted. Rash is not papular, not vesicular and not urticarial. No erythema. No pallor.  Psychiatric:  He has a normal mood and affect.     Diagnostic studies:    Spirometry: results abnormal (FEV1: 1.78/48%, FVC: 2.41/50%, FEV1/FVC: 74%).    Spirometry consistent with possible restrictive disease. Xopenex/Atrovent nebulizer treatment given in clinic with significant improvement in FEV1 and FVC per ATS criteria.  Overall, the values nearly normalized.  The FEV1 increased 58% and the FVC increased 36%.  Allergy Studies: none       Salvatore Marvel, MD  [facility] [place] [facility] [place]

## 2018-07-20 NOTE — Patient Instructions (Addendum)
1. Moderate persistent asthma, uncomplicated - Lung testing looked terrible, but it did improve with the nebulizer treatment.  - Restart the Symbicort 160/4.5 two puffs twice daily as a controller medication. - Start the prednisone pack provided. - Daily controller medication(s): Symbicort 160/4.62mcg two puffs twice daily with spacer - Prior to physical activity: ProAir 2 puffs 10-15 minutes before physical activity. - Rescue medications: ProAir 4 puffs every 4-6 hours as needed - Asthma control goals:  * Full participation in all desired activities (may need albuterol before activity) * Albuterol use two time or less a week on average (not counting use with activity) * Cough interfering with sleep two time or less a month * Oral steroids no more than once a year * No hospitalizations  2. Chronic rhinitis - Continue with: Flonase (fluticasone) one spray per nostril daily and Astelin (azelastine) 2 sprays per nostril 1-2 times daily as needed - You can use an extra dose of the antihistamine, if needed, for breakthrough symptoms.  - Consider nasal saline rinses 1-2 times daily to remove allergens from the nasal cavities as well as help with mucous clearance (this is especially helpful to do before the nasal sprays are given)  3. Return in about 6 months (around 01/18/2019).   Please inform us of any Emergency Department visits, hospitalizations, or changes in symptoms. Call us before going to the ED for breathing or allergy symptoms since we might be able to fit you in for a sick visit. Feel free to contact us anytime with any questions, problems, or concerns.  It was a pleasure to see you again today!  Websites that have reliable patient information: 1. American Academy of Asthma, Allergy, and Immunology: www.aaaai.org 2. Food Allergy Research and Education (FARE): foodallergy.org 3. Mothers of Asthmatics: http://www.asthmacommunitynetwork.org 4. American College of Allergy, Asthma, and  Immunology: www.acaai.org

## 2018-07-21 ENCOUNTER — Other Ambulatory Visit: Payer: Self-pay

## 2018-07-21 MED ORDER — BUDESONIDE-FORMOTEROL FUMARATE 160-4.5 MCG/ACT IN AERO: INHALATION_SPRAY | RESPIRATORY_TRACT | 1 refills | Status: DC

## 2018-07-27 ENCOUNTER — Other Ambulatory Visit: Payer: Self-pay | Admitting: Allergy & Immunology

## 2018-09-08 ENCOUNTER — Other Ambulatory Visit: Payer: Self-pay

## 2018-09-08 MED ORDER — AZELASTINE HCL 0.1 % NA SOLN: 2.0000 | Freq: Two times a day (BID) | NASAL | 5 refills | Status: DC

## 2019-01-04 ENCOUNTER — Other Ambulatory Visit: Payer: Self-pay

## 2019-01-04 ENCOUNTER — Ambulatory Visit: Payer: 59 | Admitting: Allergy & Immunology

## 2019-01-04 ENCOUNTER — Encounter: Payer: Self-pay | Admitting: Allergy & Immunology

## 2019-01-04 VITALS — BP 120/78 | HR 86 | Temp 97.6°F | Resp 16 | Ht 67.0 in | Wt 210.4 lb

## 2019-01-04 DIAGNOSIS — J310 Chronic rhinitis: Secondary | ICD-10-CM | POA: Diagnosis not present

## 2019-01-04 DIAGNOSIS — J4540 Moderate persistent asthma, uncomplicated: Secondary | ICD-10-CM

## 2019-01-04 DIAGNOSIS — K219 Gastro-esophageal reflux disease without esophagitis: Secondary | ICD-10-CM | POA: Diagnosis not present

## 2019-01-04 MED ORDER — BUDESONIDE-FORMOTEROL FUMARATE 160-4.5 MCG/ACT IN AERO: INHALATION_SPRAY | RESPIRATORY_TRACT | 1 refills | Status: DC

## 2019-01-04 MED ORDER — OMEPRAZOLE 20 MG PO CPDR: 20.0000 mg | DELAYED_RELEASE_CAPSULE | Freq: Every day | ORAL | 1 refills | Status: DC

## 2019-01-04 MED ORDER — FLUTICASONE PROPIONATE 50 MCG/ACT NA SUSP: NASAL | 1 refills | Status: DC

## 2019-01-04 MED ORDER — AZELASTINE HCL 0.1 % NA SOLN: 2.0000 | Freq: Two times a day (BID) | NASAL | 1 refills | Status: DC

## 2019-01-04 MED ORDER — ALBUTEROL SULFATE HFA 108 (90 BASE) MCG/ACT IN AERS: 2.0000 | INHALATION_SPRAY | RESPIRATORY_TRACT | 2 refills | Status: AC | PRN

## 2019-01-04 NOTE — Patient Instructions (Addendum)
1. Moderate persistent asthma, uncomplicated - Lung testing looked fairly good today. - I am not going to worry too much about your low pulse ox.  - If you develop shortness of breath or fever or any concerning new symptom over the next few days, I would recommend getting tests for COVID (you can call us and we can arrange that).  - Daily controller medication(s): Symbicort 160/4.40mcg two puffs twice daily with spacer  - Prior to physical activity: ProAir 2 puffs 10-15 minutes before physical activity. - Rescue medications: ProAir 4 puffs every 4-6 hours as needed - Asthma control goals: tivities (may need albuterol before activity) * Albuterol use two time or less a wee * Full participation in all desired ack on average (not counting use with activity) * Cough interfering with sleep two time or less a month * Oral steroids no more than once a year * No hospitalizations  2. Chronic rhinitis - Continue with: Flonase (fluticasone) one spray per nostril daily and Astelin (azelastine) 2 sprays per nostril 1-2 times daily as needed - You can use an extra dose of the antihistamine, if needed, for breakthrough symptoms.  - Consider nasal saline rinses 1-2 times daily to remove allergens from the nasal cavities as well as help with mucous clearance (this is especially helpful to do before the nasal sprays are given)  3. Return in about 6 months (around 07/07/2019).   Please inform us of any Emergency Department visits, hospitalizations, or changes in symptoms. Call us before going to the ED for breathing or allergy symptoms since we might be able to fit you in for a sick visit. Feel free to contact us anytime with any questions, problems, or concerns.  It was a pleasure to see you again today!  Websites that have reliable patient information: 1. American Academy of Asthma, Allergy, and Immunology: www.aaaai.org 2. Food Allergy Research and Education (FARE): foodallergy.org 3. Mothers of  Asthmatics: http://www.asthmacommunitynetwork.org 4. American College of Allergy, Asthma, and Immunology: www.acaai.org

## 2019-01-04 NOTE — Progress Notes (Signed)
FOLLOW UP  Date of Service/Encounter:  01/04/19   Assessment:   Moderate persistent asthma, uncomplicated   Gastroesophageal reflux disease  Chronic rhinitis  Isolated hypoxia (went up to 95%)   Plan/Recommendations:   1. Moderate persistent asthma, uncomplicated - Lung testing looked fairly good today. - I am not going to worry too much about your low pulse ox.  - If you develop shortness of breath or fever or any concerning new symptom over the next few days, I would recommend getting tests for COVID (you can call us and we can arrange that).  - Daily controller medication(s): Symbicort 160/4.23mcg two puffs twice daily with spacer  - Prior to physical activity: ProAir 2 puffs 10-15 minutes before physical activity. - Rescue medications: ProAir 4 puffs every 4-6 hours as needed - Asthma control goals: tivities (may need albuterol before activity) * Albuterol use two time or less a wee * Full participation in all desired ack on average (not counting use with activity) * Cough interfering with sleep two time or less a month * Oral steroids no more than once a year * No hospitalizations  2. Chronic rhinitis - Continue with: Flonase (fluticasone) one spray per nostril daily and Astelin (azelastine) 2 sprays per nostril 1-2 times daily as needed - You can use an extra dose of the antihistamine, if needed, for breakthrough symptoms.  - Consider nasal saline rinses 1-2 times daily to remove allergens from the nasal cavities as well as help with mucous clearance (this is especially helpful to do before the nasal sprays are given)  3. Return in about 6 months (around 07/07/2019).   Subjective:   Christian Mata is a 53 y.o. male presenting today for follow up of  Chief Complaint  Patient presents with  . Asthma    Christian Mata has a history of the following: Patient Active Problem List   Diagnosis Date Noted  . Primary osteoarthritis of right knee 12/11/2015  .  Abnormal weight gain 12/11/2015    History obtained from: chart review and patient.  Christian Mata is a 53 y.o. male presenting for a follow up visit. He was last seen in February 2020. At that time, he presented for a sick visit. We gave him a nebulizer treatment and started him on a prednisone burst. We continued him on Symbicort two puffs BID with albuterol as needed.    Since the last visit, he has mostly done well. He does have a low pulse ox today. He has not had any fevers. He is not a cigarette smoker at all but he does do occasional pot. He is using her Symbicort two puffs twice daily. He continues to work for a [employer]. He reports that his asthma control has been well controlled. He is not using her albuterol very frequently.   He has lost 35 pounds.   Asthma/Respiratory Symptom History: He remains on the Symbicort 2 puffs twice daily.  This seems to be controlling his symptoms fairly well.  He has had no prednisone since last time I saw him.  He reports sleeping well at night.  He especially has been breathing better since he has lost weight since last visit.  He has not required any emergency room visits.  He is not using his rescue inhaler much at all.  ACT score is 22, indicating excellent asthma control.  Allergic Rhinitis Symptom History: He is currently on Flonase as well as Astelin nasal sprays as needed.  He does not  use nasal saline rinses very often.  He does not use a regular antihistamine.  He has not required antibiotics since last visit.  Otherwise, there have been no changes to his past medical history, surgical history, family history, or social history. 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     Review of Systems  Constitutional: Negative.  Negative for chills, fever, malaise/fatigue and weight loss.  HENT: Negative.  Negative for congestion, ear discharge, ear pain and sore throat.   Eyes: Negative for pain, discharge and redness.  Respiratory: Negative for  cough, sputum production, shortness of breath and wheezing.   Cardiovascular: Negative.  Negative for chest pain and palpitations.  Gastrointestinal: Negative for abdominal pain, constipation, diarrhea, heartburn, nausea and vomiting.  Skin: Negative.  Negative for itching and rash.  Neurological: Negative for dizziness and headaches.  Endo/Heme/Allergies: Negative for environmental allergies. Does not bruise/bleed easily.       Objective:   Blood pressure 120/78, pulse 86, temperature 97.6 F (36.4 C), temperature source Temporal, resp. rate 16, height 5\' 7"  (1.702 m), weight 210 lb 6.4 oz (95.4 kg), SpO2 94 %. Body mass index is 32.95 kg/m.   Physical Exam:  Physical Exam  Constitutional: He appears well-developed.  Boisterous male. Very pleasant and interactive.   HENT:  Head: Normocephalic and atraumatic.  Right Ear: Tympanic membrane, external ear and ear canal normal.  Left Ear: Tympanic membrane, external ear and ear canal normal.  Nose: Mucosal edema present. No rhinorrhea, nasal deformity or septal deviation. No epistaxis. Right sinus exhibits no maxillary sinus tenderness and no frontal sinus tenderness. Left sinus exhibits no maxillary sinus tenderness and no frontal sinus tenderness.  Mouth/Throat: Uvula is midline and oropharynx is clear and moist. Mucous membranes are not pale and not dry.  Eyes: Pupils are equal, round, and reactive to light. Conjunctivae and EOM are normal. Right eye exhibits no chemosis and no discharge. Left eye exhibits no chemosis and no discharge. Right conjunctiva is not injected. Left conjunctiva is not injected.  Cardiovascular: Normal rate, regular rhythm and normal heart sounds.  Respiratory: Effort normal and breath sounds normal. No accessory muscle usage. No tachypnea. No respiratory distress. He has no wheezes. He has no rhonchi. He has no rales. He exhibits no tenderness.  No increased work of breathing. No crackles or wheezes noted.    Lymphadenopathy:    He has no cervical adenopathy.  Neurological: He is alert.  Skin: No abrasion, no petechiae and no rash noted. Rash is not papular, not vesicular and not urticarial. No erythema. No pallor.  Psychiatric: He has a normal mood and affect.     Diagnostic studies:    Spirometry: results normal (FEV1: 2.96/86%, FVC: 3.45/77%, FEV1/FVC: 86%).    Spirometry consistent with normal pattern.   Allergy Studies: none           Salvatore Marvel, MD  [facility]

## 2019-03-25 ENCOUNTER — Telehealth: Payer: Self-pay

## 2019-03-25 NOTE — Telephone Encounter (Signed)
Pt came into clinic asking for samples of nasacort.  No mention of nasacort in his chart.  Asked Dr Nelva Bush if this is okay to give, she said yes.  Directions 1-2 sprays each nostril daily.

## 2019-03-28 ENCOUNTER — Other Ambulatory Visit: Payer: Self-pay

## 2019-03-28 MED ORDER — BUDESONIDE-FORMOTEROL FUMARATE 160-4.5 MCG/ACT IN AERO: 2.0000 | INHALATION_SPRAY | Freq: Two times a day (BID) | RESPIRATORY_TRACT | 1 refills | Status: DC

## 2019-07-05 ENCOUNTER — Ambulatory Visit: Payer: 59 | Admitting: Allergy & Immunology

## 2019-07-05 ENCOUNTER — Encounter: Payer: Self-pay | Admitting: Allergy & Immunology

## 2019-07-05 ENCOUNTER — Other Ambulatory Visit: Payer: Self-pay

## 2019-07-05 VITALS — BP 118/76 | HR 84 | Temp 97.6°F | Resp 18

## 2019-07-05 DIAGNOSIS — J4540 Moderate persistent asthma, uncomplicated: Secondary | ICD-10-CM | POA: Diagnosis not present

## 2019-07-05 DIAGNOSIS — J310 Chronic rhinitis: Secondary | ICD-10-CM | POA: Diagnosis not present

## 2019-07-05 DIAGNOSIS — K219 Gastro-esophageal reflux disease without esophagitis: Secondary | ICD-10-CM | POA: Diagnosis not present

## 2019-07-05 NOTE — Patient Instructions (Addendum)
1. Moderate persistent asthma, uncomplicated - Lung testing looked fairly good today. - Sample of Breo provided to use when symptoms worsen. - Daily controller medication(s): NONE - Prior to physical activity: albuterol 2 puffs 10-15 minutes before physical activity. - Rescue medications: albuterol 4 puffs every 4-6 hours as needed - Changes during respiratory infections or worsening symptoms: Add on Breo 177mcg 1 puff once daily for TWO WEEKS. - Asthma control goals:  * Full participation in all desired activities (may need albuterol before activity) * Albuterol use two time or less a week on average (not counting use with activity) * Cough interfering with sleep two time or less a month * Oral steroids no more than once a year * No hospitalizations  2. Chronic rhinitis - Continue with: Astelin (azelastine) a spray per nostril one time daily - Try getting the fluticasone (generic for Flonase) at [employer] or [employer] or even [employer].  - You can use an extra dose of the antihistamine, if needed, for breakthrough symptoms.  - Consider nasal saline rinses 1-2 times daily to remove allergens from the nasal cavities as well as help with mucous clearance (this is especially helpful to do before the nasal sprays are given)  3. Return in about 1 year (around 07/04/2020). This can be an in-person, a virtual Webex or a telephone follow up visit.   Please inform us of any Emergency Department visits, hospitalizations, or changes in symptoms. Call us before going to the ED for breathing or allergy symptoms since we might be able to fit you in for a sick visit. Feel free to contact us anytime with any questions, problems, or concerns.  It was a pleasure to see you again today!  Websites that have reliable patient information: 1. American Academy of Asthma, Allergy, and Immunology: www.aaaai.org 2. Food Allergy Research and Education (FARE): foodallergy.org 3. Mothers of Asthmatics:  http://www.asthmacommunitynetwork.org 4. American College of Allergy, Asthma, and Immunology: www.acaai.org   COVID-19 Vaccine Information can be found at: [email or web address].co.uk For questions related to vaccine distribution or appointments, please email [email or web address] or call [phone].     "Like" [place] on Facebook and Instagram for our latest updates!        Make sure you are registered to vote! If you have moved or changed any of your contact information, you will need to get this updated before voting!  In some cases, you MAY be able to register to vote online: [email or web address]

## 2019-07-05 NOTE — Progress Notes (Signed)
FOLLOW UP  Date of Service/Encounter:  07/05/19   Assessment:   Moderate persistent asthma, uncomplicated   Gastroesophageal reflux disease  Chronic rhinitis  Plan/Recommendations:   1. Moderate persistent asthma, uncomplicated - Lung testing looked fairly good today. - Sample of Breo provided to use when symptoms worsen. - Daily controller medication(s): NONE - Prior to physical activity: albuterol 2 puffs 10-15 minutes before physical activity. - Rescue medications: albuterol 4 puffs every 4-6 hours as needed - Changes during respiratory infections or worsening symptoms: Add on Breo 134mcg 1 puff once daily for TWO WEEKS. - Asthma control goals:  * Full participation in all desired activities (may need albuterol before activity) * Albuterol use two time or less a week on average (not counting use with activity) * Cough interfering with sleep two time or less a month * Oral steroids no more than once a year * No hospitalizations  2. Chronic rhinitis - Continue with: Astelin (azelastine) a spray per nostril one time daily - Try getting the fluticasone (generic for Flonase) at [employer] or [employer] or even [employer].  - You can use an extra dose of the antihistamine, if needed, for breakthrough symptoms.  - Consider nasal saline rinses 1-2 times daily to remove allergens from the nasal cavities as well as help with mucous clearance (this is especially helpful to do before the nasal sprays are given)  3. Return in about 1 year (around 07/04/2020). This can be an in-person, a virtual Webex or a telephone follow up visit.  Subjective:   Christian Mata is a 54 y.o. male presenting today for follow up of  Chief Complaint  Patient presents with  . Follow-up    Christian Mata has a history of the following: Patient Active Problem List   Diagnosis Date Noted  . Primary osteoarthritis of right knee 12/11/2015  . Abnormal weight gain 12/11/2015    History obtained from: chart  review and patient.  Christian Mata is a 54 y.o. male presenting for a follow up visit.  He was last seen in August 2020.  At that time, he actually had a low pulse ox of 95% but was otherwise asymptomatic.  We continued Symbicort 160/4.5 mcg 2 puffs twice daily with a spacer as well as albuterol as needed.  For his rhinitis, we continued Flonase and Astelin.  Since the last visit, she has done well. He has been using more vitamins and whatnot. He is sleeping well. He does report some sinus congestion which he treats with Navage. He uses the saline packs and rinses out the stuff. He is using the Astelin and will occasionally uses Afrin. He does rarely use the Afrin and realizes the problems with using it on a routine basis.    His breathing is actually been fairly well controlled.  He does have his rescue inhaler which she was able to afford.  The Symbicort, unfortunately, went up to over $100 once the co-pay card stopped working.  He has not tried any other controller medications.  He has been using the Symbicort only as needed, particularly during physical activity.  He reports sleeping well at night without any nighttime coughing.  He has not needed any prednisone and has not been to the emergency room for any symptoms at all.  He is open to trying other inhaler medications.  He continues to work of weight loss. He apparently weighed nearly 300 pounds at one point and lost around 100 pounds.   Otherwise, there have been  no changes to his past medical history, surgical history, family history, or social history.    Review of Systems  Constitutional: Negative.  Negative for chills, fever, malaise/fatigue and weight loss.  HENT: Negative for congestion, ear discharge, ear pain, sinus pain and sore throat.   Eyes: Negative for pain, discharge and redness.  Respiratory: Negative for cough, sputum production, shortness of breath and wheezing.   Cardiovascular: Negative.  Negative for chest pain and  palpitations.  Gastrointestinal: Negative for abdominal pain, constipation, diarrhea, heartburn, nausea and vomiting.  Skin: Negative.  Negative for itching and rash.  Neurological: Negative for dizziness and headaches.  Endo/Heme/Allergies: Negative for environmental allergies. Does not bruise/bleed easily.       Objective:   Blood pressure 118/76, pulse 84, temperature 97.6 F (36.4 C), temperature source Temporal, resp. rate 18, SpO2 96 %. There is no height or weight on file to calculate BMI.   Physical Exam:  Physical Exam  Constitutional: He appears well-developed.  Very talkative pleasant male.   HENT:  Head: Normocephalic and atraumatic.  Right Ear: Tympanic membrane, external ear and ear canal normal.  Left Ear: Tympanic membrane, external ear and ear canal normal.  Nose: Mucosal edema and rhinorrhea present. No nasal deformity or septal deviation. No epistaxis. Right sinus exhibits no maxillary sinus tenderness and no frontal sinus tenderness. Left sinus exhibits no maxillary sinus tenderness and no frontal sinus tenderness.  Mouth/Throat: Uvula is midline and oropharynx is clear and moist. Mucous membranes are not pale and not dry.  Eyes: Pupils are equal, round, and reactive to light. Conjunctivae and EOM are normal. Right eye exhibits no chemosis and no discharge. Left eye exhibits no chemosis and no discharge. Right conjunctiva is not injected. Left conjunctiva is not injected.  Cardiovascular: Normal rate, regular rhythm and normal heart sounds.  Respiratory: Effort normal and breath sounds normal. No accessory muscle usage. No tachypnea. No respiratory distress. He has no wheezes. He has no rhonchi. He has no rales. He exhibits no tenderness.  Moving air well in all lung fields.   Lymphadenopathy:    He has no cervical adenopathy.  Neurological: He is alert.  Skin: No abrasion, no petechiae and no rash noted. Rash is not papular, not vesicular and not urticarial. No  erythema. No pallor.  No eczematous or urticarial lesions noted.   Psychiatric: He has a normal mood and affect.      Diagnostic studies: none       Salvatore Marvel, MD  [facility] [place]

## 2019-09-02 ENCOUNTER — Ambulatory Visit: Payer: 59 | Attending: Internal Medicine

## 2019-09-02 DIAGNOSIS — Z23 Encounter for immunization: Secondary | ICD-10-CM

## 2019-09-02 NOTE — Progress Notes (Signed)
   Covid-19 Vaccination Clinic  Name:  DANARIUS MERRYWEATHER    MRN: [id number] DOB: 11-24-1965  09/02/2019  Mr. Bley was observed post Covid-19 immunization for 15 minutes without incident. He was provided with Vaccine Information Sheet and instruction to access the V-Safe system.   Mr. Mcclish was instructed to call 911 with any severe reactions post vaccine: Marland Kitchen Difficulty breathing  . Swelling of face and throat  . A fast heartbeat  . A bad rash all over body  . Dizziness and weakness   Immunizations Administered    Name Date Dose VIS Date Route   Pfizer COVID-19 Vaccine 09/02/2019  5:01 PM 0.3 mL 05/06/2019 Intramuscular   Manufacturer: [place]   Lot: [id number]   [place]: [id number]

## 2019-09-26 ENCOUNTER — Ambulatory Visit: Payer: 59 | Attending: Internal Medicine

## 2019-09-26 DIAGNOSIS — Z23 Encounter for immunization: Secondary | ICD-10-CM

## 2019-09-26 NOTE — Progress Notes (Signed)
   Covid-19 Vaccination Clinic  Name:  FAUST DOCKSTADER    MRN: [id number] DOB: 1966/05/14  09/26/2019  Mr. Latter was observed post Covid-19 immunization for 15 minutes without incident. He was provided with Vaccine Information Sheet and instruction to access the V-Safe system.   Mr. Marella was instructed to call 911 with any severe reactions post vaccine: Marland Kitchen Difficulty breathing  . Swelling of face and throat  . A fast heartbeat  . A bad rash all over body  . Dizziness and weakness   Immunizations Administered    Name Date Dose VIS Date Route   Pfizer COVID-19 Vaccine 09/26/2019  4:34 PM 0.3 mL 07/20/2018 Intramuscular   Manufacturer: [place]   Lot: [id number]   [place]: [id number]

## 2020-07-10 ENCOUNTER — Ambulatory Visit: Payer: 59 | Admitting: Allergy & Immunology

## 2022-05-07 ENCOUNTER — Ambulatory Visit (INDEPENDENT_AMBULATORY_CARE_PROVIDER_SITE_OTHER): Payer: 59

## 2022-05-07 ENCOUNTER — Encounter: Payer: Self-pay | Admitting: Podiatry

## 2022-05-07 ENCOUNTER — Ambulatory Visit: Payer: 59 | Admitting: Podiatry

## 2022-05-07 VITALS — BP 155/84

## 2022-05-07 DIAGNOSIS — M722 Plantar fascial fibromatosis: Secondary | ICD-10-CM

## 2022-05-07 DIAGNOSIS — Q828 Other specified congenital malformations of skin: Secondary | ICD-10-CM

## 2022-05-07 DIAGNOSIS — M79672 Pain in left foot: Secondary | ICD-10-CM

## 2022-05-07 MED ORDER — TRIAMCINOLONE ACETONIDE 10 MG/ML IJ SUSP
10.0000 mg | Freq: Once | INTRAMUSCULAR | Status: AC
  Administered 2022-05-07: 10 mg

## 2022-05-07 NOTE — Patient Instructions (Signed)

## 2022-05-07 NOTE — Progress Notes (Signed)
Subjective:   Patient ID: Christian Mata, male   DOB: 56 y.o.   MRN: [id number]   HPI Patient states he has developed pain in his heel for the last couple months and is very active and has some pain into the arch and forefoot.  Also has a lesion on the outside of the left foot that can become tender at times and he tries to work on himself.  Patient does not smoke likes to be active   Review of Systems  All other systems reviewed and are negative.       Objective:  Physical Exam Vitals and nursing note reviewed.  Constitutional:      Appearance: He is well-developed.  Pulmonary:     Effort: Pulmonary effort is normal.  Musculoskeletal:        General: Normal range of motion.  Skin:    General: Skin is warm.  Neurological:     Mental Status: He is alert.     Neurovascular status intact muscle strength found to be adequate range of motion adequate.  Patient is found to have inflammation pain of the plantar fascia left at the insertional point tendon calcaneus with fluid buildup around the medial band that does have a lucid keratotic lesion plantar aspect left lateral mid arch area.  Good digital perfusion well-oriented x 3     Assessment:  Inflammatory fasciitis plantar heel left more acute along with porokeratotic lesion     Plan:  H&P the x-ray left reviewed discussed treatment options and did sterile prep injected the fascia 3 mg Kenalog 5 mg Xylocaine and did courtesy debridement of left no iatrogenic bleeding noted.  Reappoint as needed  X-rays indicate small spur no indication stress fracture arthritis

## 2022-05-08 ENCOUNTER — Other Ambulatory Visit: Payer: Self-pay | Admitting: Podiatry

## 2022-05-08 DIAGNOSIS — M722 Plantar fascial fibromatosis: Secondary | ICD-10-CM

## 2022-05-08 DIAGNOSIS — M79672 Pain in left foot: Secondary | ICD-10-CM

## 2022-05-08 DIAGNOSIS — Q828 Other specified congenital malformations of skin: Secondary | ICD-10-CM

## 2024-03-16 ENCOUNTER — Encounter: Payer: Self-pay | Admitting: Allergy

## 2024-03-16 ENCOUNTER — Ambulatory Visit: Admitting: Allergy

## 2024-03-16 ENCOUNTER — Other Ambulatory Visit: Payer: Self-pay

## 2024-03-16 VITALS — BP 120/78 | HR 80 | Temp 98.1°F | Resp 18 | Ht 69.0 in | Wt 228.8 lb

## 2024-03-16 DIAGNOSIS — J310 Chronic rhinitis: Secondary | ICD-10-CM | POA: Diagnosis not present

## 2024-03-16 DIAGNOSIS — J4540 Moderate persistent asthma, uncomplicated: Secondary | ICD-10-CM

## 2024-03-16 MED ORDER — RYALTRIS 665-25 MCG/ACT NA SUSP: 2.0000 | Freq: Two times a day (BID) | NASAL | 5 refills | Status: AC | PRN

## 2024-03-16 MED ORDER — CARBINOXAMINE MALEATE 4 MG PO TABS: 8.0000 mg | ORAL_TABLET | Freq: Two times a day (BID) | ORAL | 5 refills | Status: AC | PRN

## 2024-03-16 MED ORDER — AIRSUPRA 90-80 MCG/ACT IN AERO: 2.0000 | INHALATION_SPRAY | RESPIRATORY_TRACT | 1 refills | Status: AC | PRN

## 2024-03-16 NOTE — Patient Instructions (Addendum)
 1. Moderate persistent asthma - Lung testing today is lower than it was in 2021 - Daily controller medication(s): NONE - Prior to physical activity: Airsupra 2 puffs 10-15 minutes before physical activity. - Rescue medications: Airsupra 2 puffs every 4 hours as needed Valaria is a rescue inhaler to replace plain Albuterol .  Airsupra has albuterol  + budesonide  (steroid inhaled) that helps to provide longer lasting relief of symptoms while providing quick relief of symptoms.   Sample provided.  - Asthma control goals:  * Full participation in all desired activities (may need albuterol  before activity) * Albuterol  use two time or less a week on average (not counting use with activity) * Cough interfering with sleep two time or less a month * Oral steroids no more than once a year * No hospitalizations  2. Chronic rhinitis - Use Carbinoxamine 4mg  (take 2 tabs =8mg ) twice a day - Use Ryaltris nasal spray 2 sprays each nostril twice a day for runny or stuffy nose.  This goes to [facility] in [place], [place] and if covered will get mailed to you.  With using nasal sprays point tip of bottle toward eye on same side nostril and lean head slightly forward for best technique.   - Consider nasal saline rinses 1-2 times daily to remove allergens from the nasal cavities as well as help with mucous clearance (this is especially helpful to do before the nasal sprays are given) - Consider updated allergy testing   3. Return in about 4-6 months or sooner if needed   Please inform us  of any Emergency Department visits, hospitalizations, or changes in symptoms. Call us  before going to the ED for breathing or allergy symptoms since we might be able to fit you in for a sick visit. Feel free to contact us  anytime with any questions, problems, or concerns.  It was a pleasure to see meet you today!  Websites that have reliable patient information: 1. American Academy of Asthma, Allergy, and Immunology:  www.aaaai.org 2. Food Allergy Research and Education (FARE): foodallergy.org 3. Mothers of Asthmatics: http://www.asthmacommunitynetwork.org 4. American College of Allergy, Asthma, and Immunology: www.acaai.org

## 2024-03-16 NOTE — Progress Notes (Signed)
 New Patient Note  RE: DAVONN FLANERY MRN: [id number] DOB: Sep 30, 1965 Date of Office Visit: 03/16/2024  Primary care provider: Frederik Charleston, MD  Chief Complaint: Allergies  History of present illness: Christian Mata is a 58 y.o. male presenting today for evaluation of allergic rhinitis.  He would like to reestablish care.  He was last seen in our office by Dr. Iva on 07/05/2019 for asthma in rhinitis. Discussed the use of AI scribe software for clinical note transcription with the patient, who gave verbal consent to proceed.  He experiences worsening sinus symptoms, particularly during early spring and fall, characterized by a sensation of 'a liquid film' in his throat and occasional sore throat. He has been using Carbinoxamine which is his wife's prescription but does note that the dosing does help with his symptoms.  He has tried other antihistamines including Zyrtec, Allegra , Claritin which have not been as effective as they carbinoxamine.  He would like his old prescription.   He has a history of asthma and has previously used a Breo inhaler during respiratory infections. He recalls a history of bronchitis around this time last year.  Currently, he has albuterol , which he uses sparingly, primarily during spring and fall, about a couple of times a week. He has not used it since spring and suspects it may be expired. He reports difficulty with breathing tests, stating that after about five seconds, he cannot exhale any more air.  He recalls having pneumonia a couple of times, which he believes has affected his lungs. He has not been on any regular breathing maintenance medication since 2021.     He uses an off-brand Flonase  nasal spray from [employer], which he finds effective, and has previously used Astelin  nasal spray. Previous allergy testing only showed low sensitivity to cat dander.  Review of systems: 10pt ROS negative unless noted in HPI  Past medical history: Past Medical  History:  Diagnosis Date   Asthma     Past surgical history: Past Surgical History:  Procedure Laterality Date   TONSILLECTOMY AND ADENOIDECTOMY     TYMPANOSTOMY TUBE PLACEMENT      Family history:  Family History  Problem Relation Age of Onset   Cancer Mother        Lung   Cancer Father        brain   Cancer Other        bone    Social history: Lives in a home without carpeting with gas and electric heating with central cooling.  Dog in the home.  There is no concern for water damage, mildew or roaches in the home.   Medication List: Current Outpatient Medications  Medication Sig Dispense Refill   albuterol  (PROAIR  HFA) 108 (90 Base) MCG/ACT inhaler Inhale 2 puffs into the lungs every 4 (four) hours as needed for wheezing or shortness of breath. 18 g 2   Ascorbic Acid (VITAMIN C) 1000 MG tablet Take by mouth. (Patient not taking: Reported on 03/16/2024)     azelastine  (ASTELIN ) 0.1 % nasal spray Place 2 sprays into both nostrils 2 (two) times daily. 90 mL 1   zinc gluconate 50 MG tablet Take by mouth. (Patient not taking: Reported on 03/16/2024)     No current facility-administered medications for this visit.    Known medication allergies: No Known Allergies   Physical examination: Blood pressure 120/78, pulse 80, temperature 98.1 F (36.7 C), temperature source Temporal, resp. rate 18, height 5' 9 (1.753 m), weight 228 lb  12.8 oz (103.8 kg), SpO2 96%.  General: Alert, interactive, in no acute distress. HEENT: PERRLA, TMs pearly gray, turbinates mildly edematous without discharge, post-pharynx non erythematous. Neck: Supple without lymphadenopathy. Lungs: Clear to auscultation without wheezing, rhonchi or rales. {no increased work of breathing. CV: Normal S1, S2 without murmurs. Abdomen: Nondistended, nontender. Skin: Warm and dry, without lesions or rashes. Extremities:  No clubbing, cyanosis or edema. Neuro:   Grossly intact.  Diagnostics/Labs: Spirometry:  FEV1: 2.73L 78%, FVC: 3.15L 70%, ratio consistent with essentially normal spirometry for age/demographic however FVC and FVC could be better  Assessment and plan:   Moderate persistent asthma - Lung testing today is lower than it was in 2021 - Daily controller medication(s): NONE - Prior to physical activity: Airsupra 2 puffs 10-15 minutes before physical activity. - Rescue medications: Airsupra 2 puffs every 4 hours as needed Valaria is a rescue inhaler to replace plain Albuterol .  Airsupra has albuterol  + budesonide  (steroid inhaled) that helps to provide longer lasting relief of symptoms while providing quick relief of symptoms.   Sample provided.  - Asthma control goals:  * Full participation in all desired activities (may need albuterol  before activity) * Albuterol  use two time or less a week on average (not counting use with activity) * Cough interfering with sleep two time or less a month * Oral steroids no more than once a year * No hospitalizations  Chronic rhinitis - Use Carbinoxamine 4mg  (take 2 tabs =8mg ) twice a day - Use Ryaltris nasal spray 2 sprays each nostril twice a day for runny or stuffy nose.  This goes to [facility] in [place], [place] and if covered will get mailed to you.  With using nasal sprays point tip of bottle toward eye on same side nostril and lean head slightly forward for best technique.   - Consider nasal saline rinses 1-2 times daily to remove allergens from the nasal cavities as well as help with mucous clearance (this is especially helpful to do before the nasal sprays are given) - Consider updated allergy testing   Return in about 4-6 months or sooner if needed   I appreciate the opportunity to take part in [place]'s care. Please do not hesitate to contact me with questions.  Sincerely,   Danita Brain, MD Allergy/Immunology [facility]

## 2024-07-13 ENCOUNTER — Ambulatory Visit: Admitting: Allergy
